# Patient Record
Sex: Female | Born: 1950 | Race: White | Hispanic: No | Marital: Married | Smoking: Never smoker
Health system: Southern US, Community
[De-identification: ages and names within clinical notes are randomized; demographics above are authoritative.]

## PROBLEM LIST (undated history)

## (undated) DIAGNOSIS — K219 Gastro-esophageal reflux disease without esophagitis: Secondary | ICD-10-CM

## (undated) DIAGNOSIS — T7840XA Allergy, unspecified, initial encounter: Secondary | ICD-10-CM

## (undated) DIAGNOSIS — I219 Acute myocardial infarction, unspecified: Secondary | ICD-10-CM

## (undated) DIAGNOSIS — E785 Hyperlipidemia, unspecified: Secondary | ICD-10-CM

## (undated) HISTORY — PX: LUMBAR DISC SURGERY: SHX700

## (undated) HISTORY — DX: Acute myocardial infarction, unspecified: I21.9

## (undated) HISTORY — DX: Hyperlipidemia, unspecified: E78.5

## (undated) HISTORY — DX: Gastro-esophageal reflux disease without esophagitis: K21.9

## (undated) HISTORY — PX: WISDOM TOOTH EXTRACTION: SHX21

## (undated) HISTORY — DX: Allergy, unspecified, initial encounter: T78.40XA

---

## 2004-09-14 ENCOUNTER — Other Ambulatory Visit: Admission: RE | Admit: 2004-09-14 | Discharge: 2004-09-14 | Payer: Self-pay | Admitting: Obstetrics and Gynecology

## 2004-09-15 ENCOUNTER — Other Ambulatory Visit: Admission: RE | Admit: 2004-09-15 | Discharge: 2004-09-15 | Payer: Self-pay | Admitting: Obstetrics and Gynecology

## 2004-11-07 ENCOUNTER — Encounter: Admission: RE | Admit: 2004-11-07 | Discharge: 2004-11-07 | Payer: Self-pay | Admitting: Internal Medicine

## 2005-09-18 ENCOUNTER — Other Ambulatory Visit: Admission: RE | Admit: 2005-09-18 | Discharge: 2005-09-18 | Payer: Self-pay | Admitting: Obstetrics and Gynecology

## 2005-11-27 ENCOUNTER — Encounter: Admission: RE | Admit: 2005-11-27 | Discharge: 2005-11-27 | Payer: Self-pay | Admitting: Internal Medicine

## 2006-09-12 ENCOUNTER — Other Ambulatory Visit: Admission: RE | Admit: 2006-09-12 | Discharge: 2006-09-12 | Payer: Self-pay | Admitting: Obstetrics and Gynecology

## 2007-01-09 ENCOUNTER — Encounter: Admission: RE | Admit: 2007-01-09 | Discharge: 2007-01-09 | Payer: Self-pay | Admitting: Obstetrics and Gynecology

## 2008-01-12 ENCOUNTER — Encounter: Admission: RE | Admit: 2008-01-12 | Discharge: 2008-01-12 | Payer: Self-pay | Admitting: Obstetrics and Gynecology

## 2008-06-15 ENCOUNTER — Other Ambulatory Visit: Admission: RE | Admit: 2008-06-15 | Discharge: 2008-06-15 | Payer: Self-pay | Admitting: Obstetrics and Gynecology

## 2009-01-17 ENCOUNTER — Encounter: Admission: RE | Admit: 2009-01-17 | Discharge: 2009-01-17 | Payer: Self-pay | Admitting: Obstetrics and Gynecology

## 2010-01-02 ENCOUNTER — Encounter: Admission: RE | Admit: 2010-01-02 | Discharge: 2010-01-02 | Payer: Self-pay | Admitting: Otolaryngology

## 2010-01-25 ENCOUNTER — Encounter: Admission: RE | Admit: 2010-01-25 | Discharge: 2010-01-25 | Payer: Self-pay | Admitting: Obstetrics and Gynecology

## 2011-01-19 ENCOUNTER — Other Ambulatory Visit: Payer: Self-pay | Admitting: Obstetrics and Gynecology

## 2011-01-19 DIAGNOSIS — Z1231 Encounter for screening mammogram for malignant neoplasm of breast: Secondary | ICD-10-CM

## 2011-02-27 ENCOUNTER — Ambulatory Visit: Payer: Self-pay

## 2011-03-08 ENCOUNTER — Ambulatory Visit
Admission: RE | Admit: 2011-03-08 | Discharge: 2011-03-08 | Disposition: A | Discharge status: Home / Self Care | Payer: 59 | Source: Ambulatory Visit | Attending: Obstetrics and Gynecology | Admitting: Obstetrics and Gynecology

## 2011-03-08 DIAGNOSIS — Z1231 Encounter for screening mammogram for malignant neoplasm of breast: Secondary | ICD-10-CM

## 2011-04-27 ENCOUNTER — Emergency Department (HOSPITAL_COMMUNITY): Payer: 59

## 2011-04-27 ENCOUNTER — Emergency Department (HOSPITAL_COMMUNITY)
Admission: EM | Admit: 2011-04-27 | Discharge: 2011-04-27 | Disposition: A | Discharge status: Home / Self Care | Payer: 59 | Attending: Emergency Medicine | Admitting: Emergency Medicine

## 2011-04-27 DIAGNOSIS — M79609 Pain in unspecified limb: Secondary | ICD-10-CM | POA: Insufficient documentation

## 2011-04-27 DIAGNOSIS — Z79899 Other long term (current) drug therapy: Secondary | ICD-10-CM | POA: Insufficient documentation

## 2011-04-27 DIAGNOSIS — M545 Low back pain, unspecified: Secondary | ICD-10-CM | POA: Insufficient documentation

## 2011-04-27 DIAGNOSIS — M5126 Other intervertebral disc displacement, lumbar region: Secondary | ICD-10-CM | POA: Insufficient documentation

## 2011-04-27 DIAGNOSIS — IMO0002 Reserved for concepts with insufficient information to code with codable children: Secondary | ICD-10-CM | POA: Insufficient documentation

## 2011-04-30 ENCOUNTER — Inpatient Hospital Stay (HOSPITAL_COMMUNITY): Payer: 59

## 2011-04-30 ENCOUNTER — Inpatient Hospital Stay (HOSPITAL_COMMUNITY)
Admission: AD | Admit: 2011-04-30 | Discharge: 2011-05-05 | DRG: 491 | Disposition: A | Discharge status: Home / Self Care | Payer: 59 | Source: Ambulatory Visit | Attending: Specialist | Admitting: Specialist

## 2011-04-30 DIAGNOSIS — M216X9 Other acquired deformities of unspecified foot: Secondary | ICD-10-CM | POA: Diagnosis present

## 2011-04-30 DIAGNOSIS — M5126 Other intervertebral disc displacement, lumbar region: Principal | ICD-10-CM | POA: Diagnosis present

## 2011-04-30 DIAGNOSIS — M48061 Spinal stenosis, lumbar region without neurogenic claudication: Secondary | ICD-10-CM | POA: Diagnosis present

## 2011-04-30 LAB — CBC
MCHC: 33.1 g/dL (ref 30.0–36.0)
MCV: 89.8 fL (ref 78.0–100.0)
Platelets: 215 10*3/uL (ref 150–400)

## 2011-04-30 LAB — URINE MICROSCOPIC-ADD ON

## 2011-04-30 LAB — BASIC METABOLIC PANEL
CO2: 25 mEq/L (ref 19–32)
Calcium: 9.7 mg/dL (ref 8.4–10.5)
Glucose, Bld: 165 mg/dL — ABNORMAL HIGH (ref 70–99)
Potassium: 3.5 mEq/L (ref 3.5–5.1)
Sodium: 135 mEq/L (ref 135–145)

## 2011-04-30 LAB — URINALYSIS, ROUTINE W REFLEX MICROSCOPIC
Bilirubin Urine: NEGATIVE
Protein, ur: NEGATIVE mg/dL
Specific Gravity, Urine: 1.011 (ref 1.005–1.030)
Urobilinogen, UA: 0.2 mg/dL (ref 0.0–1.0)

## 2011-05-02 ENCOUNTER — Inpatient Hospital Stay (HOSPITAL_COMMUNITY): Payer: 59

## 2011-05-02 ENCOUNTER — Other Ambulatory Visit: Payer: Self-pay | Admitting: Specialist

## 2011-05-03 LAB — BASIC METABOLIC PANEL
Chloride: 98 mEq/L (ref 96–112)
Creatinine, Ser: 0.5 mg/dL (ref 0.50–1.10)
GFR calc non Af Amer: >60 mL/min (ref >60)
Glucose, Bld: 114 mg/dL — ABNORMAL HIGH (ref 70–99)
Potassium: 4 mEq/L (ref 3.5–5.1)

## 2011-05-03 LAB — CBC
HCT: 38.1 % (ref 36.0–46.0)
WBC: 6.8 10*3/uL (ref 4.0–10.5)

## 2011-05-10 NOTE — Op Note (Signed)
Allison Rangel, Allison Rangel NO.:  [id number]  MEDICAL RECORD NO.:  [id number]  LOCATION:  1603                         FACILITY:  [facility]  PHYSICIAN:  Jene Every, M.D.    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  PROCEDURES PERFORMED:  Lumbar decompression L4-5 with lateral recess decompression foraminotomy of L4-5, microdiskectomy at L4-5, retrieval free fragment, application of DuraSeal to an attenuated region of thecal sac.  BRIEF HISTORY:  This is a 60 year old female who has had a severe right lower extremity radicular pain, unable to ambulate, was admitted to the hospital with a significant weakness in dorsiflexion of her right ankle, was unable to ambulate, required narcotic analgesics and lying prone on the hospital bed.  We admitted her on Monday evening, saw her yesterday, had persistent weakness and pain, and as well this morning on evaluation, she is indicated for decompression as she did not improve with rest, activity modification, had been given steroids, etc.  She was indicated for decompression of L4-5.  She had a central stenosis of L4-5 due to ligamentum flavum, hypertrophy, and stenosis, severely in the lateral recess; that combined with the HNP, was compressing the L5 root, generating the weakness into the right foot.  She had positive nerve root tension signs, EHL weakness, diminished sensation at L5 dermatome, indicated for decompression.  Risks and benefits discussed including bleeding, infection, damage to vascular structures, CSF leakage, epidural fibrosis, adjacent segment disease, need for fusion in future, DVT, PE, anesthetic complications, etc.  TECHNIQUE:  With the  patient in supine position, after induction of adequate general anesthesia and 1 g of Kefzol, she was placed prone on the [place] frame.  All bony prominences well-padded.  Lumbar region was prepped and draped in usual sterile fashion.  Two 18-gauge spinal needles were utilized to localize at L4-5 interspace, confirmed with x- ray.  Incision was made from spinous process of L4-5.  Subcutaneous tissue was dissected.  Electrocautery was utilized to achieve hemostasis. With a very small interlaminar window at L4-5 on the right, due to the degree of stenosis that she had, it was felt prudent to perform a central decompression to allow for retraction of the thecal sac and retrieval of the fragment.  I used a Leksell rongeur to remove the interspinous ligament and a spinous process, partially of L4 and L5. I used a straight curette, detached ligamentum flavum from the caudad edge of L4 and the cephalad edge of L5.  Due to severe lateral recess stenosis, entered only caudad edge of L4 centrally.  Some epidural fat was noted, we placed neural patty beneath the ligamentum flavum. Removed ligamentum flavum from the right, performed hemilaminotomies of the caudad L4 and cephalad edge of L5.  A severe lateral recess stenosis was noted.  Removed some ligamentum, also placed a hockey-stick beneath ligamentum flavum.  Clearly, there was some severe stenosis noted laterally and some adhesions.  We gently developed a plane  between the ligamentum flavum and the thecal sac.  We removed ligamentum flavum from the left centrally.  There was some attenuated area noted right on the central part of the dorsum of the thecal sac measuring 5 x 5 mm, no leakage of fluid noted.  This was covered and we continued with the procedure.  The L5 root was completely compressed due to the disk herniation, the stenosis, and the ligamentum flavum hypertrophy, and the disk herniation.  The disk herniation was identified just  posterior to the disk space and inferior to the disk space as well.  We retrieved a small fragment with a migrated caudad with a neural probe and a micropituitary.  There was still a central disk herniation into the right, I gently mobilized the L5 root medially and to the lateral aspect of it made a small incision in the annulus and then removed 3 fragments from the interlaminar space with a nerve hook and micropituitary.  This decompressed the L5 root, allowed to gently mobilized this then medially.  The disk herniation extended from lateral to medial to the midline.  We gently mobilized the thecal sac to the midline, and copious portion of disk material was removed the disk space with straight and upbiting pituitary.  She had a fairly degenerated disk with a fair amount of disk material that was removed.  We obtained a confirmatory radiograph with a Penfield 4 in the interlaminar space.  Next, we had performed foraminotomies of L5 and of L4 as well.  After full diskectomy of the herniated material, we had released 1 cm excursion of the L5 root medial to the pedicle without difficulty.  The nerve root was erythematous and edematous.  Disk space copiously irrigated with antibiotic irrigation.  No residual disk herniation beneath thecal sac, axilla of the root, the shoulder of the root.  Next, attention was turned after copious irrigation, we removed the neural patties, a small area of the attenuation of thecal sac 5 x 5 mm.  There again no CSF leakage was noted.  We felt, however, that it was appropriate to cover this with a DuraSeal.  Just prior to that and with coverage of that, we had decompressed the lateral recess on the left somewhat.  I then chambered the DuraSeal in the appropriate fashion and this was injected to right over the central laminotomy, creating a seal. Following appropriate curing, Valsalva was performed.  There was no evidence of CSF leakage.  Again, a hockey stick  probe had passed freely up foramen of L5 and L4.  Patella was well-decompressed.  Following this, we removed the [facility] retractor.  Paraspinous muscle inspected, no evidence of active bleeding.  Dorsolumbar fascia was reapproximated with #1 Vicryl and figure-of-8 suture, watertight closure, subcutaneous with 0 and 2-0 Vicryl simple sutures.  Skin was reapproximated with 4-0 subcuticular Prolene.  Wound reinforced with Steri-Strips.  Sterile dressing applied.  Placed supine on hospital bed, extubated without difficulty, and transported to recovery room in satisfactory condition.  The patient tolerated procedure well.  No complications.  ASSISTANT:  [name], PA  Minimal blood loss.     Jene Every, M.D.     Cordelia Pen  D:  05/02/2011  T:  05/03/2011  Job:  [id number]  Electronically Signed by Jene Every M.D. on 05/10/2011 07:03:30 AM

## 2011-05-15 NOTE — H&P (Signed)
NAMEALIZAE, BECHTEL NO.:  [id number]  MEDICAL RECORD NO.:  [id number]  LOCATION:                                 FACILITY:  PHYSICIAN:  Jene Every, M.D.    DATE OF BIRTH:  1951/04/28  DATE OF ADMISSION:  04/30/2011 DATE OF DISCHARGE:                             HISTORY & PHYSICAL   CHIEF COMPLAINT:  Right lower extremity pain.  HISTORY:  Allison Rangel presented to our office on the 30th with severe onset of right lower extremity pain.  She is a pleasant 60 year old female who approximately 2 months ago had some low back pain following pulling weight.  She was seen and received chiropractic therapy.  Then approximately 2 weeks ago, she noted significant increase in lower extremity pain with numbness and weakness in the lower extremity.  She was seen by primary care physician who injected cortisone, placed on steroids.  The pain continued to escalate.  She was then seen at [facility] Emergency Room where they performed MRI study, which showed disk herniation at L4-5, which had migrated caudal.  She presents to our office in severe distress with significant neurogenic signs.  So this time, she needed direct admit for pain control and definitive operative intervention.  We did discuss risks and benefits of lumbar decompression.  The patient is in agreement to proceed accordingly.  MEDICAL HISTORY:  Noncontributory.  CURRENT MEDICATIONS:  Currently on prednisone 40 mg daily, oxycodone, diazepam.  She also takes a multivitamin.  ALLERGIES:  None.  SOCIAL HISTORY:  Negative tobacco.  Negative for alcohol.  She is married, lives with her husband.  REVIEW OF SYSTEMS:  The patient denies any fever, chills, night sweats, or bleeding tendencies.  CNS:  No blurred, double vision, seizure, headache, or paralysis.  Respiratory:  No shortness of breath, productive cough, or hemoptysis.  Cardiovascular:  No shortness of breath, angina, or orthopnea.  GU:  No dysuria,  hematuria, or discharge. GI: No nausea, vomiting, diarrhea, constipation, melena, or bloody stools.  Musculoskeletal:  Per HPI.  PHYSICAL EXAMINATION:  GENERAL:  The patient is seen upright, in moderate distress.  She is most comfortable in a prone position. HEENT:  Atraumatic.  Pupils equal, round, react to light. CHEST:  Clear to auscultation bilaterally. HEART:  Regular rate and rhythm. ABDOMEN:  Soft, nontender. EXTREMITIES:  Lower extremities, she does have positive straight leg raise on the right, it produces buttock, thigh and calf pain. Significant EHL weakness on the right as well as weakness with dorsiflexion.  She has hyperreflexic patellar reflex.  Some mild dorsiflexion weakness on the left.  Decreased sensation along the L5 dermatome.  Again, MRI study was reviewed by Dr. Shelle Iron, which does show severe multifactorial stenosis L4-5 with disk herniation.  PLAN:  At this point, the patient will be direct admitted for pain control.  We will schedule for operative intervention later in the week for a lumbar decompression and microdiskectomy bilaterally.     [name], P.A.   ______________________________ Jene Every, M.D.    CS/MEDQ  D:  05/03/2011  T:  05/04/2011  Job:  [id number]  Electronically Signed by [name] P.A. on 05/14/2011 03:39:37 PM Electronically Signed  by Jene Every M.D. on 05/15/2011 01:24:24 PM

## 2011-06-01 NOTE — Discharge Summary (Signed)
  Allison Rangel, [place] NO.:  [id number]  MEDICAL RECORD NO.:  [id number]  LOCATION:  1603                         FACILITY:  [facility]  PHYSICIAN:  [name], P.A.DATE OF BIRTH:  1951-03-17  DATE OF ADMISSION:  04/30/2011 DATE OF DISCHARGE:  05/05/2011                              DISCHARGE SUMMARY   ADMISSION DIAGNOSIS:  Right lower extremity pain secondary to herniated nucleus pulposus at L4-L5.  DISCHARGE DIAGNOSIS:  Right lower extremity pain secondary to herniated nucleus pulposus at L4-L5, status post lumbar decompression microdiskectomy at L4-L5.  HISTORY:  [name] is a pleasant female.  She presented to our office with severe onset of right lower extremity pain, ongoing for quite some time, it worsened over the past several weeks.  MR study at [facility] Emergency Room showed disk herniation at L4-L5 with caudal migration. The patient in our office does have positive neurogenic signs ofsignificant footdrop, uncontrolled pain.  At this point, it was felt admission for pain control followed by definitive operative intervention was necessary.  PROCEDURE:  The patient was taken to the OR on May 02, 2011, underwent lumbar decompression, microdiskectomy at L4-L5 with application of Durafill secondary to attenuated region of thecal sac.  SURGEON:  Jene Every, M.D.  ASSISTANT:  [name], P.A.  ANESTHESIA:  General.  COMPLICATIONS:  None.  CONSULTS:  PT/OT Care Management.  HOSPITAL COURSE:  The patient was initially admitted for pain control. Then operative intervention was performed as per the above procedure note.  Postop day #1, the patient was doing significantly better.  She denied any headache or shortness of breath.  She noted improvement in lower extremity symptoms, in foot strength.  Over the course of the next several days, PT/OT was consulted.  Activity was increased.  Vital signs remained stable.  She was afebrile.   She could note improvement in lower extremity symptoms, with sensation and weakness.  Postop day #3, it was felt the patient was stable to be discharged home with any home health needs met.  She will follow with Dr. Shelle Iron from 10 to 14 days postoperatively.  ACTIVITY:  She is to walk as tolerated utilizing proper back precautions.  MEDICATIONS:  As per med rec sheet.  DIET:  As tolerated.  CONDITION ON DISCHARGE:  Stable.  FINAL DIAGNOSIS:  Status post lumbar decompression, microdiskectomy at L4-L5.     [name], P.A.     CS/MEDQ  D:  05/28/2011  T:  05/28/2011  Job:  [id number]  Electronically Signed by [name] P.A. on 05/30/2011 04:56:32 PM Electronically Signed by Jene Every M.D. on 06/01/2011 02:41:40 PM

## 2012-01-08 ENCOUNTER — Other Ambulatory Visit (HOSPITAL_COMMUNITY)
Admission: RE | Admit: 2012-01-08 | Discharge: 2012-01-08 | Disposition: A | Discharge status: Home / Self Care | Payer: 59 | Source: Ambulatory Visit | Attending: Obstetrics and Gynecology | Admitting: Obstetrics and Gynecology

## 2012-01-08 ENCOUNTER — Other Ambulatory Visit: Payer: Self-pay | Admitting: Nurse Practitioner

## 2012-01-08 DIAGNOSIS — Z1159 Encounter for screening for other viral diseases: Secondary | ICD-10-CM | POA: Insufficient documentation

## 2012-01-08 DIAGNOSIS — Z01419 Encounter for gynecological examination (general) (routine) without abnormal findings: Secondary | ICD-10-CM | POA: Insufficient documentation

## 2012-05-13 ENCOUNTER — Other Ambulatory Visit: Payer: Self-pay | Admitting: Internal Medicine

## 2012-05-13 DIAGNOSIS — Z1231 Encounter for screening mammogram for malignant neoplasm of breast: Secondary | ICD-10-CM

## 2012-05-29 ENCOUNTER — Ambulatory Visit
Admission: RE | Admit: 2012-05-29 | Discharge: 2012-05-29 | Disposition: A | Discharge status: Home / Self Care | Payer: 59 | Source: Ambulatory Visit | Attending: Internal Medicine | Admitting: Internal Medicine

## 2012-05-29 DIAGNOSIS — Z1231 Encounter for screening mammogram for malignant neoplasm of breast: Secondary | ICD-10-CM

## 2013-06-12 ENCOUNTER — Other Ambulatory Visit: Payer: Self-pay

## 2013-06-12 DIAGNOSIS — Z1231 Encounter for screening mammogram for malignant neoplasm of breast: Secondary | ICD-10-CM

## 2013-07-02 ENCOUNTER — Ambulatory Visit
Admission: RE | Admit: 2013-07-02 | Discharge: 2013-07-02 | Disposition: A | Discharge status: Home / Self Care | Payer: BC Managed Care – PPO | Source: Ambulatory Visit

## 2013-07-02 DIAGNOSIS — Z1231 Encounter for screening mammogram for malignant neoplasm of breast: Secondary | ICD-10-CM

## 2014-08-20 ENCOUNTER — Other Ambulatory Visit: Payer: Self-pay

## 2014-08-20 DIAGNOSIS — Z1231 Encounter for screening mammogram for malignant neoplasm of breast: Secondary | ICD-10-CM

## 2014-09-03 ENCOUNTER — Ambulatory Visit: Payer: BC Managed Care – PPO

## 2014-09-13 ENCOUNTER — Ambulatory Visit: Payer: BC Managed Care – PPO

## 2014-09-20 ENCOUNTER — Ambulatory Visit
Admission: RE | Admit: 2014-09-20 | Discharge: 2014-09-20 | Disposition: A | Discharge status: Home / Self Care | Payer: BC Managed Care – PPO | Source: Ambulatory Visit

## 2014-09-20 DIAGNOSIS — Z1231 Encounter for screening mammogram for malignant neoplasm of breast: Secondary | ICD-10-CM

## 2015-01-18 ENCOUNTER — Other Ambulatory Visit: Payer: Self-pay | Admitting: Nurse Practitioner

## 2015-01-18 ENCOUNTER — Other Ambulatory Visit (HOSPITAL_COMMUNITY)
Admission: RE | Admit: 2015-01-18 | Discharge: 2015-01-18 | Disposition: A | Discharge status: Home / Self Care | Payer: Self-pay | Source: Ambulatory Visit | Attending: Nurse Practitioner | Admitting: Nurse Practitioner

## 2015-01-18 DIAGNOSIS — Z1151 Encounter for screening for human papillomavirus (HPV): Secondary | ICD-10-CM | POA: Insufficient documentation

## 2015-01-18 DIAGNOSIS — Z01419 Encounter for gynecological examination (general) (routine) without abnormal findings: Secondary | ICD-10-CM | POA: Insufficient documentation

## 2015-01-19 ENCOUNTER — Other Ambulatory Visit: Payer: Self-pay | Admitting: Internal Medicine

## 2015-01-19 DIAGNOSIS — K222 Esophageal obstruction: Secondary | ICD-10-CM

## 2015-01-19 LAB — CYTOLOGY - PAP

## 2015-01-24 ENCOUNTER — Ambulatory Visit
Admission: RE | Admit: 2015-01-24 | Discharge: 2015-01-24 | Disposition: A | Discharge status: Home / Self Care | Payer: No Typology Code available for payment source | Source: Ambulatory Visit | Attending: Internal Medicine | Admitting: Internal Medicine

## 2015-01-24 DIAGNOSIS — K222 Esophageal obstruction: Secondary | ICD-10-CM

## 2016-05-18 ENCOUNTER — Other Ambulatory Visit: Payer: Self-pay | Admitting: Nurse Practitioner

## 2016-05-18 DIAGNOSIS — Z1231 Encounter for screening mammogram for malignant neoplasm of breast: Secondary | ICD-10-CM

## 2016-05-28 ENCOUNTER — Ambulatory Visit
Admission: RE | Admit: 2016-05-28 | Discharge: 2016-05-28 | Disposition: A | Discharge status: Home / Self Care | Payer: No Typology Code available for payment source | Source: Ambulatory Visit | Attending: Nurse Practitioner | Admitting: Nurse Practitioner

## 2016-05-28 DIAGNOSIS — Z1231 Encounter for screening mammogram for malignant neoplasm of breast: Secondary | ICD-10-CM

## 2016-10-21 IMAGING — RF DG ESOPHAGUS
12 series · 12 of 12 positions shown · non-contrast
Comparison: Esophagram January 02, 2010

CLINICAL DATA: Evaluate esophageal stricture

EXAM:
ESOPHOGRAM / BARIUM SWALLOW / BARIUM TABLET STUDY
TECHNIQUE: Combined double contrast and single contrast examination performed
using effervescent crystals, thick barium liquid, and thin barium
liquid. The patient was observed with fluoroscopy swallowing a 13 mm
barium sulphate tablet.
FLUOROSCOPY TIME:  Radiation Exposure Index (as provided by the
fluoroscopic device): 20 dOycm3
If the device does not provide the exposure index:
Fluoroscopy Time:  1 minutes, 0 seconds
Number of Acquired Images:  12

[Series 1: run · 1 of 1 slices shown (1 of 12)]
[im 1/1]
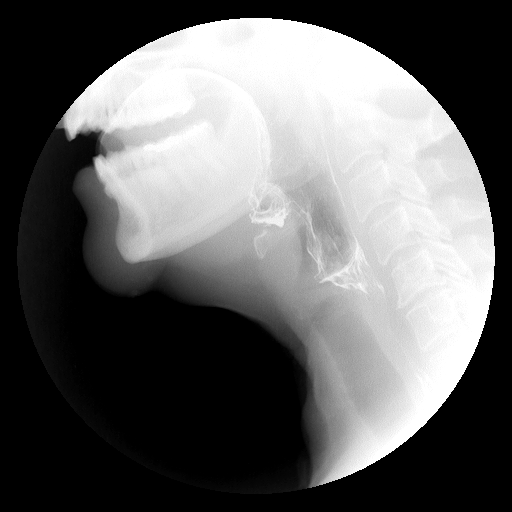

[Series 2: run · 1 of 1 slices shown (2 of 12)]
[im 1/1]
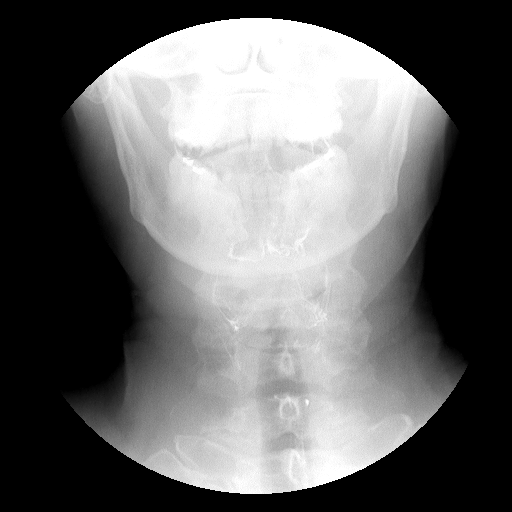

[Series 3: run · 1 of 1 slices shown (3 of 12)]
[im 1/1]
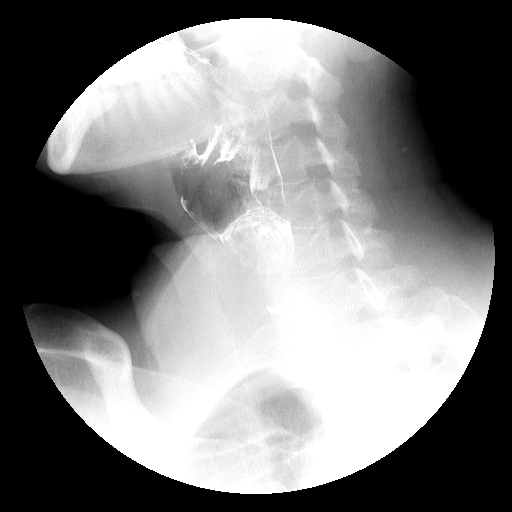

[Series 4: run · 1 of 1 slices shown (4 of 12)]
[im 1/1]
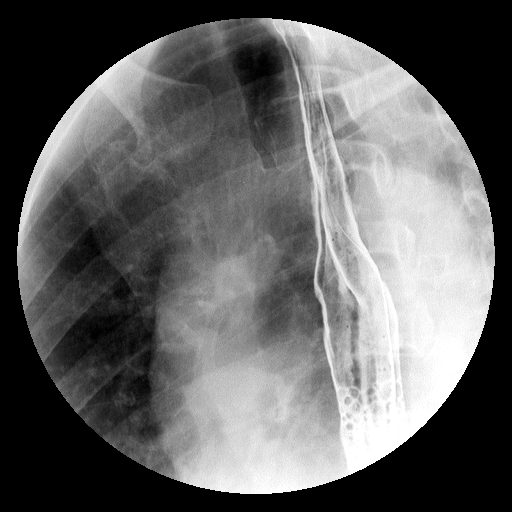

[Series 5: run · 1 of 1 slices shown (5 of 12)]
[im 1/1]
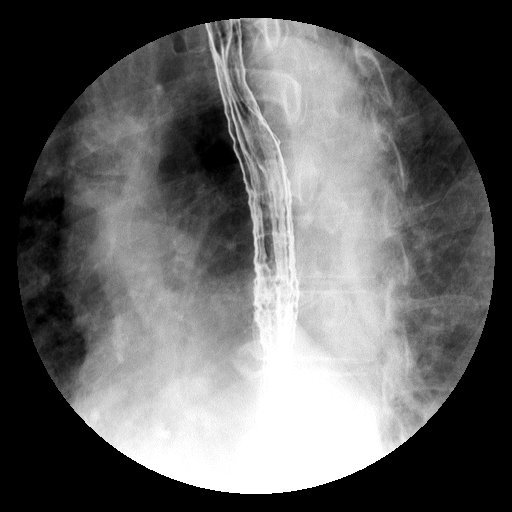

[Series 6: run · 1 of 1 slices shown (6 of 12)]
[im 1/1]
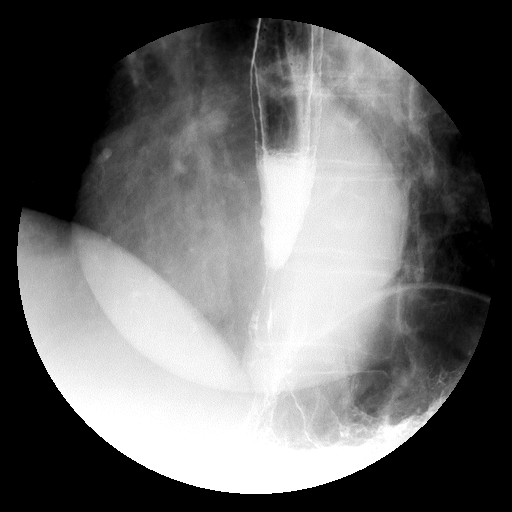

[Series 7: run · 1 of 1 slices shown (7 of 12)]
[im 1/1]
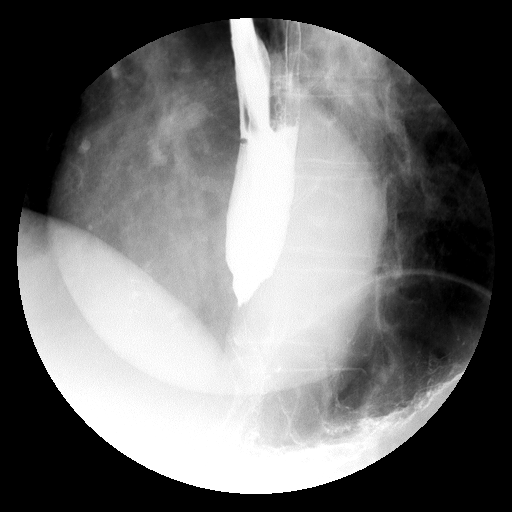

[Series 8: run · 1 of 1 slices shown (8 of 12)]
[im 1/1]
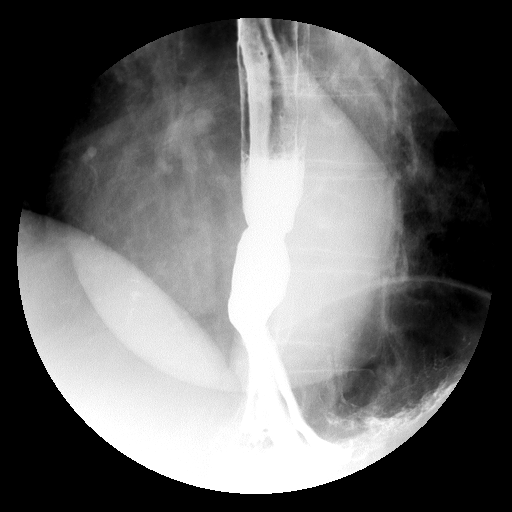

[Series 9: run · 1 of 1 slices shown (9 of 12)]
[im 1/1]
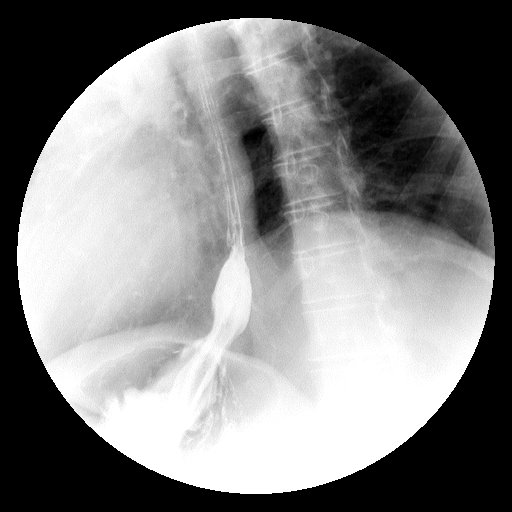

[Series 10: run · 1 of 1 slices shown (10 of 12)]
[im 1/1]
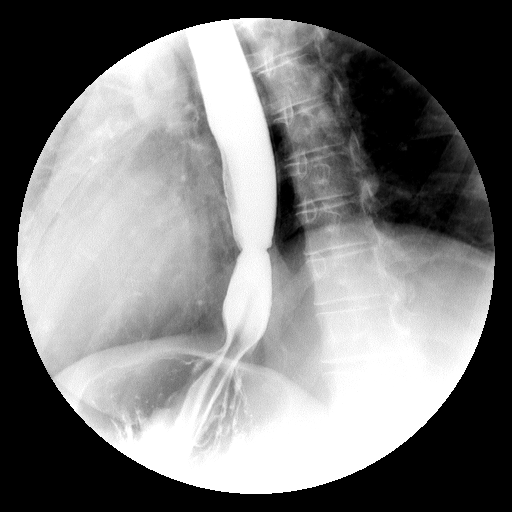

[Series 11: run · 1 of 1 slices shown (11 of 12)]
[im 1/1]
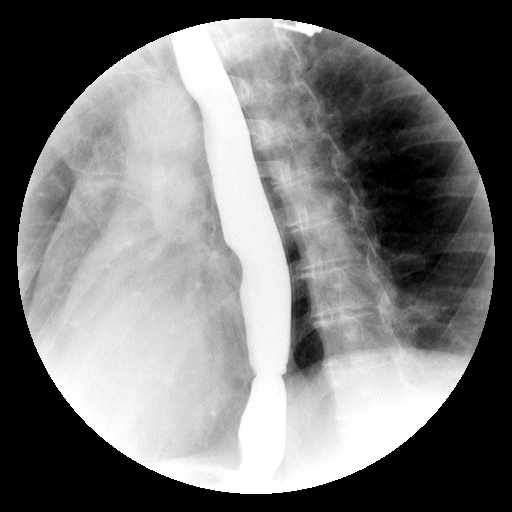

[Series 12: run · 1 of 1 slices shown (12 of 12)]
[im 1/1]
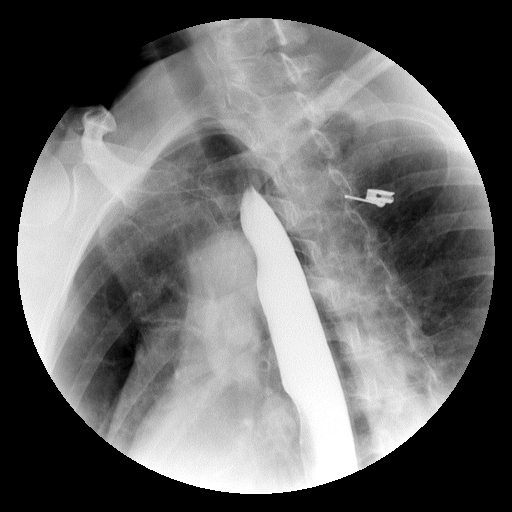

[12 of 12 positions shown; findings below may reference images not displayed]

FINDINGS: The cervical esophagus distended well. There was no laryngeal
penetration of the barium. The thoracic esophagus also distended
well. A few tertiary contractions were demonstrated. There was a
small incompletely reducible hiatal hernia and a distal esophageal
ring. This ring however did allow passage of the barium tablet
without difficulty. There was no evidence of ulceration. No reflux
was observed.
IMPRESSION: Small non reducible hiatal hernia with a distal esophageal ring.
However, the 13 mm barium tablet passed through this ring without
difficulty. There was no evidence of reflux or esophagitis.

## 2017-12-11 DIAGNOSIS — H401131 Primary open-angle glaucoma, bilateral, mild stage: Secondary | ICD-10-CM | POA: Diagnosis not present

## 2018-01-02 DIAGNOSIS — R69 Illness, unspecified: Secondary | ICD-10-CM | POA: Diagnosis not present

## 2018-03-10 DIAGNOSIS — H5213 Myopia, bilateral: Secondary | ICD-10-CM | POA: Diagnosis not present

## 2018-03-19 DIAGNOSIS — Z833 Family history of diabetes mellitus: Secondary | ICD-10-CM | POA: Diagnosis not present

## 2018-03-19 DIAGNOSIS — Z8249 Family history of ischemic heart disease and other diseases of the circulatory system: Secondary | ICD-10-CM | POA: Diagnosis not present

## 2018-03-19 DIAGNOSIS — Z809 Family history of malignant neoplasm, unspecified: Secondary | ICD-10-CM | POA: Diagnosis not present

## 2018-03-19 DIAGNOSIS — K30 Functional dyspepsia: Secondary | ICD-10-CM | POA: Diagnosis not present

## 2018-03-19 DIAGNOSIS — Z803 Family history of malignant neoplasm of breast: Secondary | ICD-10-CM | POA: Diagnosis not present

## 2018-03-19 DIAGNOSIS — Z823 Family history of stroke: Secondary | ICD-10-CM | POA: Diagnosis not present

## 2018-03-19 DIAGNOSIS — Z91038 Other insect allergy status: Secondary | ICD-10-CM | POA: Diagnosis not present

## 2018-03-19 DIAGNOSIS — H409 Unspecified glaucoma: Secondary | ICD-10-CM | POA: Diagnosis not present

## 2018-03-19 DIAGNOSIS — Z91041 Radiographic dye allergy status: Secondary | ICD-10-CM | POA: Diagnosis not present

## 2018-04-21 DIAGNOSIS — R69 Illness, unspecified: Secondary | ICD-10-CM | POA: Diagnosis not present

## 2018-04-28 DIAGNOSIS — H401131 Primary open-angle glaucoma, bilateral, mild stage: Secondary | ICD-10-CM | POA: Diagnosis not present

## 2018-06-10 DIAGNOSIS — Z23 Encounter for immunization: Secondary | ICD-10-CM | POA: Diagnosis not present

## 2018-06-10 DIAGNOSIS — K219 Gastro-esophageal reflux disease without esophagitis: Secondary | ICD-10-CM | POA: Diagnosis not present

## 2018-06-10 DIAGNOSIS — Z0001 Encounter for general adult medical examination with abnormal findings: Secondary | ICD-10-CM | POA: Diagnosis not present

## 2018-06-10 DIAGNOSIS — Z136 Encounter for screening for cardiovascular disorders: Secondary | ICD-10-CM | POA: Diagnosis not present

## 2018-06-10 DIAGNOSIS — Z13220 Encounter for screening for lipoid disorders: Secondary | ICD-10-CM | POA: Diagnosis not present

## 2018-06-10 DIAGNOSIS — Z131 Encounter for screening for diabetes mellitus: Secondary | ICD-10-CM | POA: Diagnosis not present

## 2018-06-10 DIAGNOSIS — M8580 Other specified disorders of bone density and structure, unspecified site: Secondary | ICD-10-CM | POA: Diagnosis not present

## 2018-07-01 ENCOUNTER — Encounter: Payer: Medicare HMO | Attending: Internal Medicine | Admitting: Dietician

## 2018-07-01 DIAGNOSIS — Z713 Dietary counseling and surveillance: Secondary | ICD-10-CM | POA: Insufficient documentation

## 2018-07-01 DIAGNOSIS — K219 Gastro-esophageal reflux disease without esophagitis: Secondary | ICD-10-CM | POA: Insufficient documentation

## 2018-07-01 NOTE — Progress Notes (Signed)
  Medical Nutrition Therapy:  Appt start time: 3:25pm end time: 4:25   Assessment:  Primary concerns today: questions about supplements and acid refux.   MEDICATIONS: eye drops   Supplements: Shaklee +Bone Chewable (Cal Mag Plus), Shaklee Multi+ Vita-Lea Women, Shaklee +Omega OmegaGuard, DigestZen PB Assist  Allergies: bactrim, sulfa drug    DIETARY INTAKE:    Usual eating pattern includes 3 meals and 1 snack per day.   Everyday foods include eggs, bread, fruit, vegetables, meat.  Avoided foods include    24-hr recall:  B ( AM): eggs, toast, fruit, juice, hot tea Snk ( AM):   L ( PM): sandwich, fruit or leftover Snk ( PM): dessert (cake or banana bread) D ( PM): meat, vegetables, fruit  Snk ( PM): may have cereal in the middle of the night Beverages: juice, hot tea, water, decaf tea/coffee, milk  Usual physical activity: yoga/pilates 2x/week, swimming, walk 1x/week, gardening     Nutritional Diagnosis:  NB-1.1 Food and nutrition-related knowledge deficit As related to newly diagnosed with GERD.  As evidenced by referral for MNT.    Intervention: .  Handouts given during visit include:  Gastroesophageal Reflux Disease Nutrition Therapy (NCM)  Barriers to learning/adherence to lifestyle change: none identified  Demonstrated degree of understanding via:  Teach Back   Monitoring/Evaluation:  Dietary intake, exercise, and body weight prn.

## 2018-07-07 DIAGNOSIS — H401131 Primary open-angle glaucoma, bilateral, mild stage: Secondary | ICD-10-CM | POA: Diagnosis not present

## 2018-09-12 DIAGNOSIS — Z803 Family history of malignant neoplasm of breast: Secondary | ICD-10-CM | POA: Diagnosis not present

## 2018-09-12 DIAGNOSIS — Z1231 Encounter for screening mammogram for malignant neoplasm of breast: Secondary | ICD-10-CM | POA: Diagnosis not present

## 2018-11-04 DIAGNOSIS — R69 Illness, unspecified: Secondary | ICD-10-CM | POA: Diagnosis not present

## 2018-11-14 DIAGNOSIS — H401131 Primary open-angle glaucoma, bilateral, mild stage: Secondary | ICD-10-CM | POA: Diagnosis not present

## 2019-03-06 DIAGNOSIS — H401131 Primary open-angle glaucoma, bilateral, mild stage: Secondary | ICD-10-CM | POA: Diagnosis not present

## 2019-03-30 DIAGNOSIS — Z1211 Encounter for screening for malignant neoplasm of colon: Secondary | ICD-10-CM | POA: Diagnosis not present

## 2019-03-30 DIAGNOSIS — Z1212 Encounter for screening for malignant neoplasm of rectum: Secondary | ICD-10-CM | POA: Diagnosis not present

## 2019-05-06 DIAGNOSIS — R21 Rash and other nonspecific skin eruption: Secondary | ICD-10-CM | POA: Diagnosis not present

## 2019-05-07 DIAGNOSIS — R69 Illness, unspecified: Secondary | ICD-10-CM | POA: Diagnosis not present

## 2019-07-02 DIAGNOSIS — H5213 Myopia, bilateral: Secondary | ICD-10-CM | POA: Diagnosis not present

## 2019-07-03 DIAGNOSIS — M8580 Other specified disorders of bone density and structure, unspecified site: Secondary | ICD-10-CM | POA: Diagnosis not present

## 2019-07-03 DIAGNOSIS — Z0000 Encounter for general adult medical examination without abnormal findings: Secondary | ICD-10-CM | POA: Diagnosis not present

## 2019-07-03 DIAGNOSIS — E7800 Pure hypercholesterolemia, unspecified: Secondary | ICD-10-CM | POA: Diagnosis not present

## 2019-07-03 DIAGNOSIS — Z1389 Encounter for screening for other disorder: Secondary | ICD-10-CM | POA: Diagnosis not present

## 2019-08-17 DIAGNOSIS — R69 Illness, unspecified: Secondary | ICD-10-CM | POA: Diagnosis not present

## 2019-09-14 DIAGNOSIS — Z780 Asymptomatic menopausal state: Secondary | ICD-10-CM | POA: Diagnosis not present

## 2019-09-14 DIAGNOSIS — M8589 Other specified disorders of bone density and structure, multiple sites: Secondary | ICD-10-CM | POA: Diagnosis not present

## 2019-09-14 DIAGNOSIS — Z803 Family history of malignant neoplasm of breast: Secondary | ICD-10-CM | POA: Diagnosis not present

## 2019-09-14 DIAGNOSIS — Z1231 Encounter for screening mammogram for malignant neoplasm of breast: Secondary | ICD-10-CM | POA: Diagnosis not present

## 2019-09-14 DIAGNOSIS — Z8262 Family history of osteoporosis: Secondary | ICD-10-CM | POA: Diagnosis not present

## 2019-11-06 DIAGNOSIS — H401131 Primary open-angle glaucoma, bilateral, mild stage: Secondary | ICD-10-CM | POA: Diagnosis not present

## 2019-11-12 DIAGNOSIS — R69 Illness, unspecified: Secondary | ICD-10-CM | POA: Diagnosis not present

## 2019-12-31 DIAGNOSIS — M25551 Pain in right hip: Secondary | ICD-10-CM | POA: Diagnosis not present

## 2019-12-31 DIAGNOSIS — M9905 Segmental and somatic dysfunction of pelvic region: Secondary | ICD-10-CM | POA: Diagnosis not present

## 2019-12-31 DIAGNOSIS — M9903 Segmental and somatic dysfunction of lumbar region: Secondary | ICD-10-CM | POA: Diagnosis not present

## 2019-12-31 DIAGNOSIS — M9901 Segmental and somatic dysfunction of cervical region: Secondary | ICD-10-CM | POA: Diagnosis not present

## 2020-01-04 DIAGNOSIS — M25551 Pain in right hip: Secondary | ICD-10-CM | POA: Diagnosis not present

## 2020-01-04 DIAGNOSIS — M9901 Segmental and somatic dysfunction of cervical region: Secondary | ICD-10-CM | POA: Diagnosis not present

## 2020-01-04 DIAGNOSIS — M9905 Segmental and somatic dysfunction of pelvic region: Secondary | ICD-10-CM | POA: Diagnosis not present

## 2020-01-04 DIAGNOSIS — M9903 Segmental and somatic dysfunction of lumbar region: Secondary | ICD-10-CM | POA: Diagnosis not present

## 2020-01-05 DIAGNOSIS — M25551 Pain in right hip: Secondary | ICD-10-CM | POA: Diagnosis not present

## 2020-01-05 DIAGNOSIS — M9905 Segmental and somatic dysfunction of pelvic region: Secondary | ICD-10-CM | POA: Diagnosis not present

## 2020-01-05 DIAGNOSIS — M9903 Segmental and somatic dysfunction of lumbar region: Secondary | ICD-10-CM | POA: Diagnosis not present

## 2020-01-05 DIAGNOSIS — M9901 Segmental and somatic dysfunction of cervical region: Secondary | ICD-10-CM | POA: Diagnosis not present

## 2020-01-07 DIAGNOSIS — M9903 Segmental and somatic dysfunction of lumbar region: Secondary | ICD-10-CM | POA: Diagnosis not present

## 2020-01-07 DIAGNOSIS — M25551 Pain in right hip: Secondary | ICD-10-CM | POA: Diagnosis not present

## 2020-01-07 DIAGNOSIS — M9901 Segmental and somatic dysfunction of cervical region: Secondary | ICD-10-CM | POA: Diagnosis not present

## 2020-01-07 DIAGNOSIS — M9905 Segmental and somatic dysfunction of pelvic region: Secondary | ICD-10-CM | POA: Diagnosis not present

## 2020-01-11 DIAGNOSIS — M9901 Segmental and somatic dysfunction of cervical region: Secondary | ICD-10-CM | POA: Diagnosis not present

## 2020-01-11 DIAGNOSIS — M25551 Pain in right hip: Secondary | ICD-10-CM | POA: Diagnosis not present

## 2020-01-11 DIAGNOSIS — M9903 Segmental and somatic dysfunction of lumbar region: Secondary | ICD-10-CM | POA: Diagnosis not present

## 2020-01-11 DIAGNOSIS — M9905 Segmental and somatic dysfunction of pelvic region: Secondary | ICD-10-CM | POA: Diagnosis not present

## 2020-01-12 DIAGNOSIS — M25551 Pain in right hip: Secondary | ICD-10-CM | POA: Diagnosis not present

## 2020-01-12 DIAGNOSIS — M9901 Segmental and somatic dysfunction of cervical region: Secondary | ICD-10-CM | POA: Diagnosis not present

## 2020-01-12 DIAGNOSIS — M9905 Segmental and somatic dysfunction of pelvic region: Secondary | ICD-10-CM | POA: Diagnosis not present

## 2020-01-12 DIAGNOSIS — M9903 Segmental and somatic dysfunction of lumbar region: Secondary | ICD-10-CM | POA: Diagnosis not present

## 2020-01-14 DIAGNOSIS — M9903 Segmental and somatic dysfunction of lumbar region: Secondary | ICD-10-CM | POA: Diagnosis not present

## 2020-01-14 DIAGNOSIS — M9905 Segmental and somatic dysfunction of pelvic region: Secondary | ICD-10-CM | POA: Diagnosis not present

## 2020-01-14 DIAGNOSIS — M25551 Pain in right hip: Secondary | ICD-10-CM | POA: Diagnosis not present

## 2020-01-14 DIAGNOSIS — M9901 Segmental and somatic dysfunction of cervical region: Secondary | ICD-10-CM | POA: Diagnosis not present

## 2020-01-15 DIAGNOSIS — H269 Unspecified cataract: Secondary | ICD-10-CM | POA: Diagnosis not present

## 2020-01-15 DIAGNOSIS — K219 Gastro-esophageal reflux disease without esophagitis: Secondary | ICD-10-CM | POA: Diagnosis not present

## 2020-01-15 DIAGNOSIS — H409 Unspecified glaucoma: Secondary | ICD-10-CM | POA: Diagnosis not present

## 2020-01-15 DIAGNOSIS — R030 Elevated blood-pressure reading, without diagnosis of hypertension: Secondary | ICD-10-CM | POA: Diagnosis not present

## 2020-01-15 DIAGNOSIS — Z823 Family history of stroke: Secondary | ICD-10-CM | POA: Diagnosis not present

## 2020-01-15 DIAGNOSIS — Z809 Family history of malignant neoplasm, unspecified: Secondary | ICD-10-CM | POA: Diagnosis not present

## 2020-01-19 DIAGNOSIS — M9901 Segmental and somatic dysfunction of cervical region: Secondary | ICD-10-CM | POA: Diagnosis not present

## 2020-01-19 DIAGNOSIS — M9905 Segmental and somatic dysfunction of pelvic region: Secondary | ICD-10-CM | POA: Diagnosis not present

## 2020-01-19 DIAGNOSIS — M25551 Pain in right hip: Secondary | ICD-10-CM | POA: Diagnosis not present

## 2020-01-19 DIAGNOSIS — M9903 Segmental and somatic dysfunction of lumbar region: Secondary | ICD-10-CM | POA: Diagnosis not present

## 2020-01-21 DIAGNOSIS — M9903 Segmental and somatic dysfunction of lumbar region: Secondary | ICD-10-CM | POA: Diagnosis not present

## 2020-01-21 DIAGNOSIS — M25551 Pain in right hip: Secondary | ICD-10-CM | POA: Diagnosis not present

## 2020-01-21 DIAGNOSIS — M9905 Segmental and somatic dysfunction of pelvic region: Secondary | ICD-10-CM | POA: Diagnosis not present

## 2020-01-21 DIAGNOSIS — M9901 Segmental and somatic dysfunction of cervical region: Secondary | ICD-10-CM | POA: Diagnosis not present

## 2020-01-25 DIAGNOSIS — M25551 Pain in right hip: Secondary | ICD-10-CM | POA: Diagnosis not present

## 2020-01-25 DIAGNOSIS — M9901 Segmental and somatic dysfunction of cervical region: Secondary | ICD-10-CM | POA: Diagnosis not present

## 2020-01-25 DIAGNOSIS — M9905 Segmental and somatic dysfunction of pelvic region: Secondary | ICD-10-CM | POA: Diagnosis not present

## 2020-01-25 DIAGNOSIS — M9903 Segmental and somatic dysfunction of lumbar region: Secondary | ICD-10-CM | POA: Diagnosis not present

## 2020-01-26 DIAGNOSIS — M9905 Segmental and somatic dysfunction of pelvic region: Secondary | ICD-10-CM | POA: Diagnosis not present

## 2020-01-26 DIAGNOSIS — M25551 Pain in right hip: Secondary | ICD-10-CM | POA: Diagnosis not present

## 2020-01-26 DIAGNOSIS — M9901 Segmental and somatic dysfunction of cervical region: Secondary | ICD-10-CM | POA: Diagnosis not present

## 2020-01-26 DIAGNOSIS — M9903 Segmental and somatic dysfunction of lumbar region: Secondary | ICD-10-CM | POA: Diagnosis not present

## 2020-01-28 DIAGNOSIS — M25551 Pain in right hip: Secondary | ICD-10-CM | POA: Diagnosis not present

## 2020-01-28 DIAGNOSIS — M9905 Segmental and somatic dysfunction of pelvic region: Secondary | ICD-10-CM | POA: Diagnosis not present

## 2020-01-28 DIAGNOSIS — M9901 Segmental and somatic dysfunction of cervical region: Secondary | ICD-10-CM | POA: Diagnosis not present

## 2020-01-28 DIAGNOSIS — M9903 Segmental and somatic dysfunction of lumbar region: Secondary | ICD-10-CM | POA: Diagnosis not present

## 2020-02-02 DIAGNOSIS — M9905 Segmental and somatic dysfunction of pelvic region: Secondary | ICD-10-CM | POA: Diagnosis not present

## 2020-02-02 DIAGNOSIS — M9903 Segmental and somatic dysfunction of lumbar region: Secondary | ICD-10-CM | POA: Diagnosis not present

## 2020-02-02 DIAGNOSIS — M25551 Pain in right hip: Secondary | ICD-10-CM | POA: Diagnosis not present

## 2020-02-02 DIAGNOSIS — M9901 Segmental and somatic dysfunction of cervical region: Secondary | ICD-10-CM | POA: Diagnosis not present

## 2020-02-05 DIAGNOSIS — M9901 Segmental and somatic dysfunction of cervical region: Secondary | ICD-10-CM | POA: Diagnosis not present

## 2020-02-05 DIAGNOSIS — M25551 Pain in right hip: Secondary | ICD-10-CM | POA: Diagnosis not present

## 2020-02-05 DIAGNOSIS — M9905 Segmental and somatic dysfunction of pelvic region: Secondary | ICD-10-CM | POA: Diagnosis not present

## 2020-02-05 DIAGNOSIS — M9903 Segmental and somatic dysfunction of lumbar region: Secondary | ICD-10-CM | POA: Diagnosis not present

## 2020-02-09 DIAGNOSIS — M9901 Segmental and somatic dysfunction of cervical region: Secondary | ICD-10-CM | POA: Diagnosis not present

## 2020-02-09 DIAGNOSIS — M25551 Pain in right hip: Secondary | ICD-10-CM | POA: Diagnosis not present

## 2020-02-09 DIAGNOSIS — M9905 Segmental and somatic dysfunction of pelvic region: Secondary | ICD-10-CM | POA: Diagnosis not present

## 2020-02-09 DIAGNOSIS — M9903 Segmental and somatic dysfunction of lumbar region: Secondary | ICD-10-CM | POA: Diagnosis not present

## 2020-02-11 DIAGNOSIS — M9905 Segmental and somatic dysfunction of pelvic region: Secondary | ICD-10-CM | POA: Diagnosis not present

## 2020-02-11 DIAGNOSIS — M9901 Segmental and somatic dysfunction of cervical region: Secondary | ICD-10-CM | POA: Diagnosis not present

## 2020-02-11 DIAGNOSIS — M9903 Segmental and somatic dysfunction of lumbar region: Secondary | ICD-10-CM | POA: Diagnosis not present

## 2020-02-11 DIAGNOSIS — M25551 Pain in right hip: Secondary | ICD-10-CM | POA: Diagnosis not present

## 2020-02-16 DIAGNOSIS — M9903 Segmental and somatic dysfunction of lumbar region: Secondary | ICD-10-CM | POA: Diagnosis not present

## 2020-02-16 DIAGNOSIS — M9905 Segmental and somatic dysfunction of pelvic region: Secondary | ICD-10-CM | POA: Diagnosis not present

## 2020-02-16 DIAGNOSIS — M9901 Segmental and somatic dysfunction of cervical region: Secondary | ICD-10-CM | POA: Diagnosis not present

## 2020-02-16 DIAGNOSIS — M25551 Pain in right hip: Secondary | ICD-10-CM | POA: Diagnosis not present

## 2020-02-18 DIAGNOSIS — M9903 Segmental and somatic dysfunction of lumbar region: Secondary | ICD-10-CM | POA: Diagnosis not present

## 2020-02-18 DIAGNOSIS — M9905 Segmental and somatic dysfunction of pelvic region: Secondary | ICD-10-CM | POA: Diagnosis not present

## 2020-02-18 DIAGNOSIS — M9901 Segmental and somatic dysfunction of cervical region: Secondary | ICD-10-CM | POA: Diagnosis not present

## 2020-02-18 DIAGNOSIS — M25551 Pain in right hip: Secondary | ICD-10-CM | POA: Diagnosis not present

## 2020-02-23 DIAGNOSIS — M9901 Segmental and somatic dysfunction of cervical region: Secondary | ICD-10-CM | POA: Diagnosis not present

## 2020-02-23 DIAGNOSIS — M9903 Segmental and somatic dysfunction of lumbar region: Secondary | ICD-10-CM | POA: Diagnosis not present

## 2020-02-23 DIAGNOSIS — M9905 Segmental and somatic dysfunction of pelvic region: Secondary | ICD-10-CM | POA: Diagnosis not present

## 2020-02-23 DIAGNOSIS — M25551 Pain in right hip: Secondary | ICD-10-CM | POA: Diagnosis not present

## 2020-03-08 DIAGNOSIS — H401131 Primary open-angle glaucoma, bilateral, mild stage: Secondary | ICD-10-CM | POA: Diagnosis not present

## 2020-05-26 DIAGNOSIS — R69 Illness, unspecified: Secondary | ICD-10-CM | POA: Diagnosis not present

## 2020-08-29 DIAGNOSIS — H5213 Myopia, bilateral: Secondary | ICD-10-CM | POA: Diagnosis not present

## 2020-09-14 DIAGNOSIS — Z1231 Encounter for screening mammogram for malignant neoplasm of breast: Secondary | ICD-10-CM | POA: Diagnosis not present

## 2020-09-14 DIAGNOSIS — Z803 Family history of malignant neoplasm of breast: Secondary | ICD-10-CM | POA: Diagnosis not present

## 2020-09-15 DIAGNOSIS — E7800 Pure hypercholesterolemia, unspecified: Secondary | ICD-10-CM | POA: Diagnosis not present

## 2020-09-15 DIAGNOSIS — M859 Disorder of bone density and structure, unspecified: Secondary | ICD-10-CM | POA: Diagnosis not present

## 2020-09-15 DIAGNOSIS — Z1389 Encounter for screening for other disorder: Secondary | ICD-10-CM | POA: Diagnosis not present

## 2020-09-15 DIAGNOSIS — Z0000 Encounter for general adult medical examination without abnormal findings: Secondary | ICD-10-CM | POA: Diagnosis not present

## 2020-12-27 DIAGNOSIS — Z0100 Encounter for examination of eyes and vision without abnormal findings: Secondary | ICD-10-CM | POA: Diagnosis not present

## 2020-12-27 DIAGNOSIS — H401131 Primary open-angle glaucoma, bilateral, mild stage: Secondary | ICD-10-CM | POA: Diagnosis not present

## 2021-03-03 DIAGNOSIS — R5383 Other fatigue: Secondary | ICD-10-CM | POA: Diagnosis not present

## 2021-04-25 DIAGNOSIS — H401131 Primary open-angle glaucoma, bilateral, mild stage: Secondary | ICD-10-CM | POA: Diagnosis not present

## 2021-07-10 DIAGNOSIS — H40059 Ocular hypertension, unspecified eye: Secondary | ICD-10-CM | POA: Diagnosis not present

## 2021-07-10 DIAGNOSIS — R030 Elevated blood-pressure reading, without diagnosis of hypertension: Secondary | ICD-10-CM | POA: Diagnosis not present

## 2021-07-10 DIAGNOSIS — Z881 Allergy status to other antibiotic agents status: Secondary | ICD-10-CM | POA: Diagnosis not present

## 2021-07-10 DIAGNOSIS — Z91030 Bee allergy status: Secondary | ICD-10-CM | POA: Diagnosis not present

## 2021-07-10 DIAGNOSIS — K219 Gastro-esophageal reflux disease without esophagitis: Secondary | ICD-10-CM | POA: Diagnosis not present

## 2021-07-10 DIAGNOSIS — Z882 Allergy status to sulfonamides status: Secondary | ICD-10-CM | POA: Diagnosis not present

## 2021-07-10 DIAGNOSIS — Z833 Family history of diabetes mellitus: Secondary | ICD-10-CM | POA: Diagnosis not present

## 2021-09-12 DIAGNOSIS — H5213 Myopia, bilateral: Secondary | ICD-10-CM | POA: Diagnosis not present

## 2021-09-20 DIAGNOSIS — Z1231 Encounter for screening mammogram for malignant neoplasm of breast: Secondary | ICD-10-CM | POA: Diagnosis not present

## 2021-10-30 DIAGNOSIS — Z0001 Encounter for general adult medical examination with abnormal findings: Secondary | ICD-10-CM | POA: Diagnosis not present

## 2021-10-30 DIAGNOSIS — R739 Hyperglycemia, unspecified: Secondary | ICD-10-CM | POA: Diagnosis not present

## 2021-10-30 DIAGNOSIS — N393 Stress incontinence (female) (male): Secondary | ICD-10-CM | POA: Diagnosis not present

## 2021-10-30 DIAGNOSIS — Z13220 Encounter for screening for lipoid disorders: Secondary | ICD-10-CM | POA: Diagnosis not present

## 2021-10-30 DIAGNOSIS — Z0000 Encounter for general adult medical examination without abnormal findings: Secondary | ICD-10-CM | POA: Diagnosis not present

## 2021-10-30 DIAGNOSIS — E559 Vitamin D deficiency, unspecified: Secondary | ICD-10-CM | POA: Diagnosis not present

## 2021-11-21 ENCOUNTER — Ambulatory Visit: Payer: No Typology Code available for payment source

## 2021-11-23 ENCOUNTER — Encounter: Payer: Self-pay | Admitting: Physical Therapy

## 2021-11-23 ENCOUNTER — Other Ambulatory Visit: Payer: Self-pay

## 2021-11-23 ENCOUNTER — Ambulatory Visit: Payer: Medicare HMO | Attending: Physician Assistant | Admitting: Physical Therapy

## 2021-11-23 DIAGNOSIS — N393 Stress incontinence (female) (male): Secondary | ICD-10-CM | POA: Insufficient documentation

## 2021-11-23 DIAGNOSIS — R293 Abnormal posture: Secondary | ICD-10-CM

## 2021-11-23 DIAGNOSIS — R279 Unspecified lack of coordination: Secondary | ICD-10-CM

## 2021-11-23 DIAGNOSIS — M6281 Muscle weakness (generalized): Secondary | ICD-10-CM

## 2021-11-23 NOTE — Therapy (Signed)
OUTPATIENT PHYSICAL THERAPY FEMALE PELVIC EVALUATION   Patient Name: PATCHES MONTECINO MRN: [id number] DOB:1951/01/02, 71 y.o., female Today's Date: 11/23/2021   PT End of Session - 11/23/21 1301     Visit Number 1    Date for PT Re-Evaluation 02/20/22    Authorization Type aetna medicare    PT Start Time 1135    PT Stop Time 1220    PT Time Calculation (min) 45 min    Activity Tolerance Patient tolerated treatment well    Behavior During Therapy [facility] for tasks assessed/performed             History reviewed. No pertinent past medical history. History reviewed. No pertinent surgical history. There are no problems to display for this patient.   PCP: Seward Carol, MD  REFERRING PROVIDER: Ramond Dial, PA  REFERRING DIAG: N39.3 (ICD-10-CM) - Stress incontinence (female) (female)  THERAPY DIAG:  Muscle weakness (generalized)  Abnormal posture  Unspecified lack of coordination  ONSET DATE: A few years, worsening  SUBJECTIVE:                                                                                                                                                                                           SUBJECTIVE STATEMENT: Pt reports she has a "few years" history of urinary incontinence with sneezing, laughing, coughing, walking for a long distance.  Fluid intake: Yes: 60-70 oz per day at least    Patient confirms identification and approves PT to assess pelvic floor and treatment Yes  PERTINENT HISTORY:  Back surgery at lower lumbar vertebra 2008  Sexual abuse: No  PAIN:  Are you having pain? No    BOWEL MOVEMENT Pain with bowel movement: No Type of bowel movement:Type (Bristol Stool Scale) 4  2-3 per day, no straining Fully empty rectum: No Leakage: No Pads: No Fiber supplement: No  URINATION Pain with urination: No Fully empty bladder: Yes:   Stream: Strong Urgency: No Frequency: not quicker than every 2 hours at least Leakage:  Coughing, Sneezing, Laughing, Exercise, and Lifting Pads: No  INTERCOURSE Pain with intercourse:  dryness noted Ability to have vaginal penetration:  No Types of stimulation: no pain Climax: No Marinoff Scale 0  PREGNANCY Vaginal deliveries 3 Tearing Yes: Episiotomies x3 C-section deliveries 0 Currently pregnant No  PROLAPSE None  PRECAUTIONS: None  WEIGHT BEARING RESTRICTIONS No  FALLS:  Has patient fallen in last 6 months? No, Number of falls: 0  LIVING ENVIRONMENT: Lives with: lives with their family and lives with their spouse Lives in: House/apartment  Has following equipment at home: None  OCCUPATION: retired, like painting and working  out   PLOF: Independent  PATIENT GOALS :to have less leakage   OBJECTIVE:    PATIENT SURVEYS:  UGI - 25    COGNITION:  Overall cognitive status: Within functional limits for tasks assessed     SENSATION:  Light touch: Appears intact  Proprioception: Appears intact  MUSCLE LENGTH: Bil hamstrings and adductors limited by 25%  POSTURE:  Rounded shoulders, posterior pelvic tilt  PALPATION: Internal Pelvic Floor no TTP  External Perineal Exam WFL, no TTP mild dryness noted  GENERAL WFL  LUMBARAROM/PROM  A/PROM A/PROM  11/23/2021  Flexion Limited by 25%  Extension WFL  Right lateral flexion WFL  Left lateral flexion WFL  Right rotation WFL  Left rotation WFL   (Blank rows = not tested)  LE AROM/PROM: all WFL       LE MMT:  MMT Right 11/23/2021 Left 11/23/2021  Hip flexion 4/5 4/5  Hip extension 4/5 4/5  Hip abduction 4-/5 4-/5  Hip adduction 4/5 4/5  Hip internal rotation 4/5 4/5  Hip external rotation 4/5 4/5  Knee flexion 5/5 5/5  Knee extension 5/5 5/5  Ankle dorsiflexion 5/5 5/5  Ankle plantarflexion 5/5 5/5  Ankle inversion 5/5 5/5  Ankle eversion 5/5 5/5   PELVIC MMT:   MMT  11/23/2021  Vaginal 3/5; isometric hold for 7s; 6 reps  Internal Anal Sphincter   External Anal Sphincter    Puborectalis   Diastasis Recti   (Blank rows = not tested)   TONE: WFL  PROLAPSE: none    TODAY'S TREATMENT  EVAL pt educated on exam findings, POC, HEP, vaginal moisturizers and lubricants, and the knack method   PATIENT EDUCATION:  Education details: handouts given on HEP, vaginal moisturizers and lubricants, and the knack method Person educated: Patient Education method: Consulting civil engineer, Demonstration, Tactile cues, Verbal cues, and Handouts Education comprehension: verbalized understanding, returned demonstration, and verbal cues required   HOME EXERCISE PROGRAM: [id number]  ASSESSMENT:  CLINICAL IMPRESSION: Patient is a 71 y.o. female who was seen today for physical therapy evaluation and treatment for urinary incontinence with stressors of laughing, sneezing, coughing. Pt reports she is very active and goes to a few workout classes each week. Pt reports she doesn't have leakage consistently with these stressors but does happen sometimes and wanted to address this now. Pt found to have mild weakness in bil hips, no ROM deficits or abdominal restrictions noted at eval, no pain reported, and pt denied any concerns with bowel health or habits. Pt consented to internal pelvic floor assessment vaginally and found to have decreased strength,  coordination and endurance. Pt also required cues and extra time to decrease compensatory strategies with holding breath, use of core and glutes with recruitment of pelvic floor. Pt did demonstrate improvement with isolation of breathing and improved coordination of breathing with pelvic floor with these cues and extra time. Pt tolerated well and denied additional questions after given handouts and PT going over moisturizers, lubricants, and HEP. Pt would benefit from additional PT to further address deficits.     OBJECTIVE IMPAIRMENTS decreased coordination, decreased endurance, decreased strength, impaired flexibility, improper body mechanics, and  postural dysfunction.   ACTIVITY LIMITATIONS community activity and yard work.   PERSONAL FACTORS Time since onset of injury/illness/exacerbation and 1 comorbidity: x3 vaginal births with episiotomies   are also affecting patient's functional outcome.    REHAB POTENTIAL: Good  CLINICAL DECISION MAKING: Stable/uncomplicated  EVALUATION COMPLEXITY: Low   GOALS: Goals reviewed with patient? Yes  SHORT TERM GOALS:  STG Name Target Date Goal status  1 Pt to be I with HEP.  Baseline:  12/21/2021 INITIAL  2 Pt to demonstrate at least 4/5 pelvic floor strength and ability to hold for 8s for decrease leakage.  Baseline:  12/21/2021 INITIAL  3  Baseline:    4  Baseline:    5  Baseline:    6  Baseline:    7  Baseline:     LONG TERM GOALS:   LTG Name Target Date Goal status  1 Pt to be I with advanced HEP.  Baseline: 02/20/22 INITIAL  2 Pt to demonstrate at least 5/5 bil hip strength for improved pelvic stability and functional squats without leakage.  Baseline: 02/20/22 INITIAL  3 Pt to demonstrate at least 5/5 pelvic floor strength and ability to hold contraction for 10s for improved pelvic stability and decreased strain at pelvic floor/ decrease leakage.  Baseline: 02/20/22 INITIAL  4 Pt to report no more than 1 instance of urinary leakage per month for decrease symptoms and improved QOL.  Baseline: 02/20/22 INITIAL  5  Baseline:    6  Baseline:    7  Baseline:     PLAN: PT FREQUENCY:  1x per week every other week  PT DURATION:  8 sessions  PLANNED INTERVENTIONS: Therapeutic exercises, Therapeutic activity, Neuro Muscular re-education, Balance training, Patient/Family education, Joint mobilization, scar mobilization, Taping, Biofeedback, and Manual therapy  PLAN FOR NEXT SESSION: go over handouts, pelvic floor and breathing coordination with exercises   No emotional/communication barriers or cognitive limitation. Patient is motivated to learn. Patient understands and  agrees with treatment goals and plan. PT explains patient will be examined in standing, sitting, and lying down to see how their muscles and joints work. When they are ready, they will be asked to remove their underwear so PT can examine their perineum. The patient is also given the option of providing their own chaperone as one is not provided in our facility. The patient also has the right and is explained the right to defer or refuse any part of the evaluation or treatment including the internal exam. With the patient's consent, PT will use one gloved finger to gently assess the muscles of the pelvic floor, seeing how well it contracts and relaxes and if there is muscle symmetry. After, the patient will get dressed and PT and patient will discuss exam findings and plan of care. PT and patient discuss plan of care, schedule, attendance policy and HEP activities.   Stacy Gardner, PT, DPT 02/23/231:03 PM

## 2021-11-23 NOTE — Patient Instructions (Signed)
Moisturizers ?They are used in the vagina to hydrate the mucous membrane that make up the vaginal canal. ?Designed to keep a more normal acid balance (ph) ?Once placed in the vagina, it will last between two to three days.  ?Use 2-3 times per week at bedtime  ?Ingredients to avoid is glycerin and fragrance, can increase chance of infection ?Should not be used just before sex due to causing irritation ?Most are gels administered either in a tampon-shaped applicator or as a vaginal suppository. They are non-hormonal. ? ? ?Types of Moisturizers(internal use) ? ?Vitamin E vaginal suppositories- Whole foods, Amazon ?Moist Again ?Coconut oil- can break down condoms ?Julva- (Do no use if on Tamoxifen) amazon ?Yes moisturizer- amazon ?NeuEve Silk , NeuEve Silver for menopausal or over 65 (if have severe vaginal atrophy or cancer treatments use NeuEve Silk for  1 month than move to NeuEve Silver)- Amazon, Neuve.com ?Olive and Bee intimate cream- www.oliveandbee.com.au ?Mae vaginal moisturizer- Amazon ?Aloe ? ? ? ?Creams to use externally on the Vulva area ?Desert Harvest Releveum (good for for cancer patients that had radiation to the area)- amazon or www.desertharvest.com ?V-magic cream - amazon ?Julva-amazon ?Vital "V Wild Yam salve ( help moisturize and help with thinning vulvar area, does have Beeswax ?MoodMaid Botanical Pro-Meno Wild Yam Cream- Amazon ?Desert Harvest Gele ?Cleo by Damiva labial moisturizer (Amazon,  ?Coconut or olive oil ?aloe ? ? ?Things to avoid in the vaginal area ?Do not use things to irritate the vulvar area ?No lotions just specialized creams for the vulva area- Neogyn, V-magic, No soaps; can use Aveeno or Calendula cleanser if needed. Must be gentle ?No deodorants ?No douches ?Good to sleep without underwear to let the vaginal area to air out ?No scrubbing: spread the lips to let warm water rinse over labias and pat dry ? ? ? ?Lubrication ?Used for intercourse to reduce friction ?Avoid ones that  have glycerin, nonoxynol-9, petroleum, propylene glycol, chlorhexidine gluconate, warming gels, tingling gels, icing or cooling gel, scented ?Avoid parabens due to a preservative similar to female sex hormone ?May need to be reapplied once or several times during sexual activity ?Can be applied to both partners genitals prior to vaginal penetration to minimize friction or irritation ?Prevent irritation and mucosal tears that cause post coital pain and increased the risk of vaginal and urinary tract infections ?Oil-based lubricants cannot be used with condoms due to breaking them down.  Least likely to irritate vaginal tissue.  ?Plant based-lubes are safe ?Silicone-based lubrication are thicker and last long and used for post-menopausal women ? ?Vaginal Lubricators ?Here is a list of some suggested lubricators you can use for intercourse. Use the most hypoallergenic product.  You can place on you or your partner.  ?Slippery Stuff ( water based) ?Sylk or Sliquid Natural H2O ( good  if frequent UTI?s)- walmart, amazon ?Sliquid organics silk-(aloe and silicone based ) ?Blossom Organics (www.blossom-organics.com)- (aloe based ) ?Coconut oil, olive oil -not good with condoms  ?PJur Woman Nude- (water based) amazon ?Uberlube- ( silicon) Amazon ?Aloe Vera- Sprouts has an organic one ?Yes lubricant- (water based and has plant oil based similar to silicone) Amazon ?Wet Platinum-Silicone, Target, [facility]greens ?Olive and Bee intimate cream-  www.oliveandbee.com.au ?Pink - Amazon ?Wet stuff ?Erosense Sync- walmart, amazon ?Coconu- coconu.com ? ?Things to avoid in lubricants are glycerin, warming gels, tingling gels, icing or cooling ? gels, and scented gels.  Also avoid Vaseline. ?KY jelly, Replens, and Astroglide contain chlorhexidine which kills good bacteria(lactobacilli) ? ?Things to avoid in   the vaginal area ?Do not use things to irritate the vulvar area ?No lotions- see below ?Soaps you  can use :Aveeno, Calendula, Good Clean  Love cleanser if needed. Must be gentle ?No deodorants ?No douches ?Good to sleep without underwear to let the vaginal area to air out ?No scrubbing: spread the lips to let warm water rinse over labias and pat dry ? ?Creams that can be used on the Vulva Area ?V magic-amazon, walmart ?Vital V Wild Yam Salve ?Julva- Amazon ?MoonMaid Botanical Pro-Meno Wild Yam Cream ?Coconut oil, olive oil ?Cleo by Damiva labial moisturizer -Amazon,  ?Desert Havest Releveum ( lidocaine) or Desert Harvest Gele ?Yes Moisturizer ? ?   ?THE KNACK ? ?The Knack is a strategy you may use to help to reduce or prevent leakage or passing of urine, gas or feces during an activity that causes downward force on the pelvic floor muscles.   ? ?Activities that can cause downward pressure on the pelvic floor muscles include coughing, sneezing, laughing, bending, lifting, and transitioning from different body positions such as from laying down to sitting up and sitting to standing. ? ?To perform The Knack, consciously squeeze and lift your pelvic floor muscles to perform a strong, well-timed pelvic muscle contraction BEFORE AND DURING these activities above.  As your contraction gets more coordinated and your muscles get stronger, you will become more effective in controlling your experience of incontinence or gas passing during these activities.   ? ? ? ?

## 2021-12-07 ENCOUNTER — Other Ambulatory Visit: Payer: Self-pay

## 2021-12-07 ENCOUNTER — Ambulatory Visit: Payer: Medicare HMO | Attending: Physician Assistant | Admitting: Physical Therapy

## 2021-12-07 DIAGNOSIS — R293 Abnormal posture: Secondary | ICD-10-CM

## 2021-12-07 DIAGNOSIS — M6281 Muscle weakness (generalized): Secondary | ICD-10-CM | POA: Insufficient documentation

## 2021-12-07 DIAGNOSIS — R279 Unspecified lack of coordination: Secondary | ICD-10-CM | POA: Insufficient documentation

## 2021-12-07 NOTE — Therapy (Signed)
?OUTPATIENT PHYSICAL THERAPY TREATMENT NOTE ? ? ?Patient Name: Allison Rangel ?MRN: [id number] ?DOB:[birth year], 71 y.o., female ?Today's Date: 12/07/2021 ? ?PCP: Renford Dills, MD ?REFERRING PROVIDER: Renford Dills, MD ? ? PT End of Session - 12/07/21 1106   ? ? Visit Number 2   ? Date for PT Re-Evaluation 02/20/22   ? Authorization Type aetna medicare   ? PT Start Time 1103   ? PT Stop Time 1145   ? PT Time Calculation (min) 42 min   ? Activity Tolerance Patient tolerated treatment well   ? Behavior During Therapy [facility] for tasks assessed/performed   ? ?  ?  ? ?  ? ? ?No past medical history on file. ?No past surgical history on file. ?There are no problems to display for this patient. ? ? ?REFERRING DIAG: N39.3 (ICD-10-CM) - Stress incontinence (female) (female) ? ?THERAPY DIAG:  ?Muscle weakness (generalized) ? ?Abnormal posture ? ?Unspecified lack of coordination ? ?PERTINENT HISTORY: Back surgery at lower lumbar vertebra 2008  ?Sexual abuse: No ? ?PRECAUTIONS: None ? ?SUBJECTIVE: Pt reports a couple instances of "a few drops" of urine incontinence with strong sneeze.  ? ?PAIN:  ?Are you having pain? No ? ? ? ? ?PATIENT SURVEYS:  ?UGI - 25 ?  ?  ?  ?COGNITION: ?           Overall cognitive status: Within functional limits for tasks assessed              ?            ?SENSATION: ?           Light touch: Appears intact ?           Proprioception: Appears intact ?  ?MUSCLE LENGTH: ?Bil hamstrings and adductors limited by 25% ?  ?POSTURE:  ?Rounded shoulders, posterior pelvic tilt ?  ?PALPATION: ?Internal Pelvic Floor no TTP ?  ?External Perineal Exam WFL, no TTP mild dryness noted ?  ?GENERAL WFL ?  ?LUMBARAROM/PROM ?  ?A/PROM A/PROM  ?11/23/2021  ?Flexion Limited by 25%  ?Extension WFL  ?Right lateral flexion WFL  ?Left lateral flexion WFL  ?Right rotation WFL  ?Left rotation WFL  ? (Blank rows = not tested) ?  ?LE AROM/PROM: all WFL      ?  ?LE MMT: ?  ?MMT Right ?11/23/2021 Left ?11/23/2021  ?Hip flexion 4/5 4/5   ?Hip extension 4/5 4/5  ?Hip abduction 4-/5 4-/5  ?Hip adduction 4/5 4/5  ?Hip internal rotation 4/5 4/5  ?Hip external rotation 4/5 4/5  ?Knee flexion 5/5 5/5  ?Knee extension 5/5 5/5  ?Ankle dorsiflexion 5/5 5/5  ?Ankle plantarflexion 5/5 5/5  ?Ankle inversion 5/5 5/5  ?Ankle eversion 5/5 5/5  ?  ?PELVIC MMT: ?  ?MMT   ?11/23/2021  ?Vaginal 3/5; isometric hold for 7s; 6 reps  ?Internal Anal Sphincter    ?External Anal Sphincter    ?Puborectalis    ?Diastasis Recti    ?(Blank rows = not tested) ?  ?  ?TONE: ?WFL ?  ?PROLAPSE: ?none ?  ?  ?  ?TODAY'S TREATMENT  ?*EVAL pt educated on exam findings, POC, HEP, vaginal moisturizers and lubricants, and the knack method ? ?* 12/07/2021 : hooklying core exercises: 2x10 TA activation with max verbal and tactile cues, 2x10 90 heel touch downs; 2x10 ball squeeze with exhale and TA activation; 2x10 opp arm/knee press with exhale ?Standing: squats 10# kettle bell 2x10; mario punches 2x10 5# dumbbell; palloff red band 2x10;  lunges x10 ? ?  ?PATIENT EDUCATION:  ?Education details:  HEP ?Person educated: Patient ?Education method: Explanation, Demonstration, Tactile cues, Verbal cues, and Handouts ?Education comprehension: verbalized understanding, returned demonstration, and verbal cues required ?  ?  ?HOME EXERCISE PROGRAM: ?[id number] ?  ?ASSESSMENT: ?  ?CLINICAL IMPRESSION: ?Pt presents to clinic reporting a few instances of urinary incontinence but very small amounts. Pt session focused on core activation with coordination with breathing and with strengthening exercises for decreased leakage and improved coordination of pelvic floor. Pt would benefit from additional PT to further address deficits.  ?  ?  ?  ?OBJECTIVE IMPAIRMENTS decreased coordination, decreased endurance, decreased strength, impaired flexibility, improper body mechanics, and postural dysfunction.  ?  ?ACTIVITY LIMITATIONS community activity and yard work.  ?  ?PERSONAL FACTORS Time since onset of  injury/illness/exacerbation and 1 comorbidity: x3 vaginal births with episiotomies   are also affecting patient's functional outcome.  ?  ?  ?REHAB POTENTIAL: Good ?  ?CLINICAL DECISION MAKING: Stable/uncomplicated ?  ?EVALUATION COMPLEXITY: Low ?  ?  ?GOALS: ?Goals reviewed with patient? Yes ?  ?SHORT TERM GOALS: ?  ?STG Name Target Date Goal status  ?1 Pt to be I with HEP. ?  ?Baseline:  12/21/2021 INITIAL  ?2 Pt to demonstrate at least 4/5 pelvic floor strength and ability to hold for 8s for decrease leakage.  ?Baseline:  12/21/2021 INITIAL  ?3   ?Baseline:      ?4   ?Baseline:      ?5   ?Baseline:      ?6   ?Baseline:      ?7   ?Baseline:      ?  ?LONG TERM GOALS:  ?  ?LTG Name Target Date Goal status  ?1 Pt to be I with advanced HEP. ?  ?Baseline: 02/20/22 INITIAL  ?2 Pt to demonstrate at least 5/5 bil hip strength for improved pelvic stability and functional squats without leakage.  ?Baseline: 02/20/22 INITIAL  ?3 Pt to demonstrate at least 5/5 pelvic floor strength and ability to hold contraction for 10s for improved pelvic stability and decreased strain at pelvic floor/ decrease leakage.  ?Baseline: 02/20/22 INITIAL  ?4 Pt to report no more than 1 instance of urinary leakage per month for decrease symptoms and improved QOL.  ?Baseline: 02/20/22 INITIAL  ?5   ?Baseline:      ?6   ?Baseline:      ?7   ?Baseline:      ?  ?PLAN: ?PT FREQUENCY:  1x per week every other week ?  ?PT DURATION:  8 sessions ?  ?PLANNED INTERVENTIONS: Therapeutic exercises, Therapeutic activity, Neuro Muscular re-education, Balance training, Patient/Family education, Joint mobilization, scar mobilization, Taping, Biofeedback, and Manual therapy ?  ?PLAN FOR NEXT SESSION: go over handouts, pelvic floor and breathing coordination with exercises ?  ?  ?No emotional/communication barriers or cognitive limitation. Patient is motivated to learn. Patient understands and agrees with treatment goals and plan. PT explains patient will be examined in  standing, sitting, and lying down to see how their muscles and joints work. When they are ready, they will be asked to remove their underwear so PT can examine their perineum. The patient is also given the option of providing their own chaperone as one is not provided in our facility. The patient also has the right and is explained the right to defer or refuse any part of the evaluation or treatment including the internal exam. With the patient's consent, PT will use one  gloved finger to gently assess the muscles of the pelvic floor, seeing how well it contracts and relaxes and if there is muscle symmetry. After, the patient will get dressed and PT and patient will discuss exam findings and plan of care. PT and patient discuss plan of care, schedule, attendance policy and HEP activities.  ? ? ? ?Otelia Sergeant, PT, DPT ?12/07/2309:45 AM  ? ?  ? ?

## 2021-12-18 ENCOUNTER — Encounter: Payer: No Typology Code available for payment source | Admitting: Physical Therapy

## 2021-12-28 ENCOUNTER — Ambulatory Visit: Payer: Medicare HMO | Admitting: Physical Therapy

## 2021-12-28 DIAGNOSIS — R293 Abnormal posture: Secondary | ICD-10-CM | POA: Diagnosis not present

## 2021-12-28 DIAGNOSIS — R279 Unspecified lack of coordination: Secondary | ICD-10-CM | POA: Diagnosis not present

## 2021-12-28 DIAGNOSIS — M6281 Muscle weakness (generalized): Secondary | ICD-10-CM | POA: Diagnosis not present

## 2021-12-28 NOTE — Therapy (Signed)
?OUTPATIENT PHYSICAL THERAPY TREATMENT NOTE ? ? ?Patient Name: Allison Rangel ?MRN: [id number] ?DOB:[birth year], 71 y.o., female ?Today's Date: 12/28/2021 ? ?PCP: Seward Carol, MD ?REFERRING PROVIDER: Seward Carol, MD ? ? ? ? ?No past medical history on file. ?No past surgical history on file. ?There are no problems to display for this patient. ? ? ?REFERRING DIAG: N39.3 (ICD-10-CM) - Stress incontinence (female) (female) ? ?THERAPY DIAG:  ?No diagnosis found. ? ?PERTINENT HISTORY: Back surgery at lower lumbar vertebra 2008  ?Sexual abuse: No ? ?PRECAUTIONS: None ? ?SUBJECTIVE: Pt reports she feels she is 75% better  since starting PT with less amounts of leakage and less often.  ? ?PAIN:  ?Are you having pain? No ? ? ? ? ?PATIENT SURVEYS:  ?UDI (Urinary Distress Inventory) - 2 ?  ?  ?  ?COGNITION: ?           Overall cognitive status: Within functional limits for tasks assessed              ?            ?SENSATION: ?           Light touch: Appears intact ?           Proprioception: Appears intact ?  ?MUSCLE LENGTH: ?Bil hamstrings and adductors limited by 25% ?  ?POSTURE:  ?Rounded shoulders, posterior pelvic tilt ?  ?PALPATION: ?Internal Pelvic Floor no TTP ?  ?External Perineal Exam WFL, no TTP mild dryness noted ?  ?GENERAL WFL ?  ?LUMBARAROM/PROM ?  ?A/PROM A/PROM  ?11/23/2021  ?Flexion Limited by 25%  ?Extension WFL  ?Right lateral flexion WFL  ?Left lateral flexion WFL  ?Right rotation WFL  ?Left rotation WFL  ? (Blank rows = not tested) ?  ?LE AROM/PROM: all WFL      ?  ?LE MMT: ?  ?MMT Right ?11/23/2021 Left ?11/23/2021  ?Hip flexion 4/5 4/5  ?Hip extension 4/5 4/5  ?Hip abduction 4-/5 4-/5  ?Hip adduction 4/5 4/5  ?Hip internal rotation 4/5 4/5  ?Hip external rotation 4/5 4/5  ?Knee flexion 5/5 5/5  ?Knee extension 5/5 5/5  ?Ankle dorsiflexion 5/5 5/5  ?Ankle plantarflexion 5/5 5/5  ?Ankle inversion 5/5 5/5  ?Ankle eversion 5/5 5/5  ?  ?PELVIC MMT: ?  ?MMT   ?11/23/2021 12/28/21  ?Vaginal 3/5; isometric hold  for 7s; 6 reps 5/5; isometric 10s; 9 reps  ?Internal Anal Sphincter     ?External Anal Sphincter     ?Puborectalis     ?Diastasis Recti     ?(Blank rows = not tested) ?  ?  ?TONE: ?WFL ?  ?PROLAPSE: ?none ?  ?  ?  ?TODAY'S TREATMENT  ?*EVAL pt educated on exam findings, POC, HEP, vaginal moisturizers and lubricants, and the knack method ? ?* 12/07/2021 : hooklying core exercises: 2x10 TA activation with max verbal and tactile cues, 2x10 90 heel touch downs; 2x10 ball squeeze with exhale and TA activation; 2x10 opp arm/knee press with exhale ?Standing: squats 10# kettle bell 2x10; mario punches 2x10 5# dumbbell; palloff red band 2x10; lunges x10 ? ?12/28/2021: ?Pt consented to internal vaginal pelvic floor assessment this date for reassessment and found to have improved strength, endurance, and coordination. Pt tolerated well without pain, did have some redness at vulva but reports she had intercourse this morning and usually is a little red after this.  ?During internal treatment: 2x10 pelvic floor contractions, G53 quick flicks, x5 64W holds ?Post internal treatment: pt directed in  updated HEP exercises to insure carry over of coordination of pelvic floor and breathing with core activations and technique for exercises.  ?X8 lunges bil ?X10 90/90 heel taps ?X10 bird dogs ?All others updated on HEP pt reported understanding and denied additional questions.  ? ? ?  ?PATIENT EDUCATION:  ?Education details:  HEP ?Person educated: Patient ?Education method: Explanation, Demonstration, Tactile cues, Verbal cues, and Handouts ?Education comprehension: verbalized understanding, returned demonstration, and verbal cues required ?  ?  ?HOME EXERCISE PROGRAM: ?Access Code: [email or web address] ?URL: [email or web address] ?Date: 12/28/2021 ?Prepared by: [facility] ? ?Exercises ?- Seated Diaphragmatic Breathing  - 1 x daily - 7 x weekly - 1 sets - 10 reps ?- Seated Quick Flick Pelvic  Floor Contractions  - 1 x daily - 7 x weekly - 1 sets - 10 reps ?- Seated Pelvic Floor Muscles Isometrics on Towel Roll  - 1 x daily - 7 x weekly - 1 sets - 10 reps - 10s holds ?- Sit to Stand with Pelvic Floor Contraction  - 1 x daily - 7 x weekly - 1 sets - 10 reps ?- Seated Cough with Pelvic Floor Contraction and Hand to Mouth  - 1 x daily - 7 x weekly - 1 sets - 10 reps ?- Squat  - 1 x daily - 7 x weekly - 1 sets - 10 reps ?- Mini Lunge  - 1 x daily - 7 x weekly - 1 sets - 10 reps ?- Standing Anti-Rotation Press with Anchored Resistance  - 1 x daily - 7 x weekly - 1 sets - 10 reps ?- Standing Diagonal Chop  - 1 x daily - 7 x weekly - 1 sets - 10 reps ?- Hooklying Isometric Hip Flexion  - 1 x daily - 7 x weekly - 1 sets - 10 reps ?- Supine Bridge with Mini Swiss Ball Between Knees  - 1 x daily - 7 x weekly - 1 sets - 10 reps ?- Supine 90/90 Alternating Heel Touches with Posterior Pelvic Tilt  - 1 x daily - 7 x weekly - 1 sets - 10 reps ?- Quadruped Pelvic Floor Contraction with Opposite Arm and Leg Lift  - 1 x daily - 7 x weekly - 1 sets - 10 reps ?  ?ASSESSMENT: ?  ?CLINICAL IMPRESSION: ?Pt presents to clinic reporting 1-2x per week at most with 1-2 drops of urine leakage with strong cough or sneeze only. Pt reports she feels 75% better since starting PT. Pt has met all STG and 2/3 LTG with having 1-2 instances of 1-2 drops of urine leakage a week. Pt pleased with progress and confident in DC today.  ?  ?  ?  ?OBJECTIVE IMPAIRMENTS decreased coordination, decreased endurance, decreased strength, impaired flexibility, improper body mechanics, and postural dysfunction.  ?  ?ACTIVITY LIMITATIONS community activity and yard work.  ?  ?PERSONAL FACTORS Time since onset of injury/illness/exacerbation and 1 comorbidity: x3 vaginal births with episiotomies   are also affecting patient's functional outcome.  ?  ?  ?REHAB POTENTIAL: Good ?  ?CLINICAL DECISION MAKING: Stable/uncomplicated ?  ?EVALUATION COMPLEXITY: Low ?   ?  ?GOALS: ?Goals reviewed with patient? Yes ?  ?SHORT TERM GOALS: ?  ?STG Name Target Date Goal status 12/28/21  ?1 Pt to be I with HEP. ?  ?Baseline:  12/21/2021 INITIAL MET  ?2 Pt to demonstrate at least 4/5 pelvic floor strength and ability to hold for 8s for decrease leakage.  ?  Baseline:  12/21/2021 INITIAL MET  ?3   ?Baseline:       ?4   ?Baseline:       ?5   ?Baseline:       ?6   ?Baseline:       ?7   ?Baseline:       ?  ?LONG TERM GOALS:  ?  ?LTG Name Target Date Goal status 12/28/21  ?1 Pt to be I with advanced HEP. ?  ?Baseline: 02/20/22 INITIAL MET  ?2 Pt to demonstrate at least 5/5 bil hip strength for improved pelvic stability and functional squats without leakage.  ?Baseline: 02/20/22 INITIAL MET  ?3 Pt to demonstrate at least 5/5 pelvic floor strength and ability to hold contraction for 10s for improved pelvic stability and decreased strain at pelvic floor/ decrease leakage.  ?Baseline: 02/20/22 INITIAL MET  ?4 Pt to report no more than 1 instance of urinary leakage per month for decrease symptoms and improved QOL.  ?Baseline: 02/20/22 INITIAL NOT MET  ?5   ?Baseline:       ?6   ?Baseline:       ?7   ?Baseline:       ?  ?PLAN: ?PT FREQUENCY:  1x per week every other week ?  ?PT DURATION:  8 sessions ?  ?PLANNED INTERVENTIONS: Therapeutic exercises, Therapeutic activity, Neuro Muscular re-education, Balance training, Patient/Family education, Joint mobilization, scar mobilization, Taping, Biofeedback, and Manual therapy ?  ?PLAN FOR NEXT SESSION: DC today ?  ?  ?No emotional/communication barriers or cognitive limitation. Patient is motivated to learn. Patient understands and agrees with treatment goals and plan. PT explains patient will be examined in standing, sitting, and lying down to see how their muscles and joints work. When they are ready, they will be asked to remove their underwear so PT can examine their perineum. The patient is also given the option of providing their own chaperone as one is not  provided in our facility. The patient also has the right and is explained the right to defer or refuse any part of the evaluation or treatment including the internal exam. With the patient's consent, P

## 2022-01-02 DIAGNOSIS — H401131 Primary open-angle glaucoma, bilateral, mild stage: Secondary | ICD-10-CM | POA: Diagnosis not present

## 2022-05-15 DIAGNOSIS — H401131 Primary open-angle glaucoma, bilateral, mild stage: Secondary | ICD-10-CM | POA: Diagnosis not present

## 2022-05-31 DIAGNOSIS — S96212A Strain of intrinsic muscle and tendon at ankle and foot level, left foot, initial encounter: Secondary | ICD-10-CM | POA: Diagnosis not present

## 2022-05-31 DIAGNOSIS — K219 Gastro-esophageal reflux disease without esophagitis: Secondary | ICD-10-CM | POA: Diagnosis not present

## 2022-07-11 ENCOUNTER — Encounter (HOSPITAL_COMMUNITY): Payer: Self-pay | Admitting: Emergency Medicine

## 2022-07-11 ENCOUNTER — Inpatient Hospital Stay (HOSPITAL_COMMUNITY)
Admission: EM | Admit: 2022-07-11 | Discharge: 2022-07-13 | DRG: 287 | Disposition: A | Discharge status: Home / Self Care | Payer: Medicare HMO | Attending: Cardiovascular Disease | Admitting: Cardiovascular Disease

## 2022-07-11 DIAGNOSIS — T17920A Food in respiratory tract, part unspecified causing asphyxiation, initial encounter: Secondary | ICD-10-CM | POA: Diagnosis not present

## 2022-07-11 DIAGNOSIS — I5181 Takotsubo syndrome: Secondary | ICD-10-CM | POA: Diagnosis not present

## 2022-07-11 DIAGNOSIS — I959 Hypotension, unspecified: Secondary | ICD-10-CM | POA: Diagnosis not present

## 2022-07-11 DIAGNOSIS — I252 Old myocardial infarction: Secondary | ICD-10-CM

## 2022-07-11 DIAGNOSIS — R109 Unspecified abdominal pain: Secondary | ICD-10-CM | POA: Diagnosis not present

## 2022-07-11 DIAGNOSIS — Z882 Allergy status to sulfonamides status: Secondary | ICD-10-CM

## 2022-07-11 DIAGNOSIS — I2510 Atherosclerotic heart disease of native coronary artery without angina pectoris: Secondary | ICD-10-CM | POA: Diagnosis present

## 2022-07-11 DIAGNOSIS — Z79899 Other long term (current) drug therapy: Secondary | ICD-10-CM

## 2022-07-11 DIAGNOSIS — I214 Non-ST elevation (NSTEMI) myocardial infarction: Principal | ICD-10-CM | POA: Diagnosis present

## 2022-07-11 DIAGNOSIS — R112 Nausea with vomiting, unspecified: Secondary | ICD-10-CM | POA: Diagnosis present

## 2022-07-11 DIAGNOSIS — R079 Chest pain, unspecified: Secondary | ICD-10-CM | POA: Diagnosis not present

## 2022-07-11 DIAGNOSIS — Z91030 Bee allergy status: Secondary | ICD-10-CM

## 2022-07-11 DIAGNOSIS — Z743 Need for continuous supervision: Secondary | ICD-10-CM | POA: Diagnosis not present

## 2022-07-11 DIAGNOSIS — Z888 Allergy status to other drugs, medicaments and biological substances status: Secondary | ICD-10-CM

## 2022-07-11 DIAGNOSIS — I10 Essential (primary) hypertension: Secondary | ICD-10-CM | POA: Diagnosis not present

## 2022-07-11 DIAGNOSIS — E785 Hyperlipidemia, unspecified: Secondary | ICD-10-CM | POA: Diagnosis present

## 2022-07-11 DIAGNOSIS — R7989 Other specified abnormal findings of blood chemistry: Secondary | ICD-10-CM

## 2022-07-11 DIAGNOSIS — Z91041 Radiographic dye allergy status: Secondary | ICD-10-CM

## 2022-07-11 DIAGNOSIS — R0789 Other chest pain: Secondary | ICD-10-CM | POA: Diagnosis not present

## 2022-07-11 NOTE — ED Triage Notes (Signed)
Pt BIB [facility] c/o emesis and chest pain after getting chocked on a piece of toast at about 9pm. Pt called EMS earlier tonight for same but decided not to be seen at that time.

## 2022-07-12 ENCOUNTER — Emergency Department (HOSPITAL_COMMUNITY): Payer: Medicare HMO

## 2022-07-12 ENCOUNTER — Encounter (HOSPITAL_COMMUNITY)
Admission: EM | Disposition: A | Discharge status: Home / Self Care | Payer: Self-pay | Source: Home / Self Care | Attending: Cardiovascular Disease

## 2022-07-12 ENCOUNTER — Observation Stay (HOSPITAL_COMMUNITY): Payer: Medicare HMO

## 2022-07-12 DIAGNOSIS — Z91041 Radiographic dye allergy status: Secondary | ICD-10-CM | POA: Diagnosis not present

## 2022-07-12 DIAGNOSIS — I959 Hypotension, unspecified: Secondary | ICD-10-CM | POA: Diagnosis not present

## 2022-07-12 DIAGNOSIS — Z882 Allergy status to sulfonamides status: Secondary | ICD-10-CM | POA: Diagnosis not present

## 2022-07-12 DIAGNOSIS — R112 Nausea with vomiting, unspecified: Secondary | ICD-10-CM | POA: Diagnosis not present

## 2022-07-12 DIAGNOSIS — R079 Chest pain, unspecified: Secondary | ICD-10-CM | POA: Diagnosis not present

## 2022-07-12 DIAGNOSIS — E785 Hyperlipidemia, unspecified: Secondary | ICD-10-CM | POA: Diagnosis not present

## 2022-07-12 DIAGNOSIS — Z888 Allergy status to other drugs, medicaments and biological substances status: Secondary | ICD-10-CM | POA: Diagnosis not present

## 2022-07-12 DIAGNOSIS — R7989 Other specified abnormal findings of blood chemistry: Secondary | ICD-10-CM | POA: Diagnosis not present

## 2022-07-12 DIAGNOSIS — I214 Non-ST elevation (NSTEMI) myocardial infarction: Secondary | ICD-10-CM | POA: Diagnosis not present

## 2022-07-12 DIAGNOSIS — I5181 Takotsubo syndrome: Secondary | ICD-10-CM | POA: Diagnosis not present

## 2022-07-12 DIAGNOSIS — I2510 Atherosclerotic heart disease of native coronary artery without angina pectoris: Secondary | ICD-10-CM | POA: Diagnosis not present

## 2022-07-12 DIAGNOSIS — I252 Old myocardial infarction: Secondary | ICD-10-CM | POA: Diagnosis not present

## 2022-07-12 DIAGNOSIS — Z91030 Bee allergy status: Secondary | ICD-10-CM | POA: Diagnosis not present

## 2022-07-12 DIAGNOSIS — Z79899 Other long term (current) drug therapy: Secondary | ICD-10-CM | POA: Diagnosis not present

## 2022-07-12 HISTORY — PX: LEFT HEART CATH AND CORONARY ANGIOGRAPHY: CATH118249

## 2022-07-12 LAB — LIPASE, BLOOD: Lipase: 36 U/L (ref 11–51)

## 2022-07-12 LAB — CBC
HCT: 45.3 % (ref 36.0–46.0)
Hemoglobin: 15.6 g/dL — ABNORMAL HIGH (ref 12.0–15.0)
MCH: 31 pg (ref 26.0–34.0)
MCHC: 34.4 g/dL (ref 30.0–36.0)
MCV: 89.9 fL (ref 80.0–100.0)
Platelets: 213 10*3/uL (ref 150–400)
RBC: 5.04 MIL/uL (ref 3.87–5.11)
RDW: 12.9 % (ref 11.5–15.5)
WBC: 5.7 10*3/uL (ref 4.0–10.5)
nRBC: 0 % (ref 0.0–0.2)

## 2022-07-12 LAB — HEPATIC FUNCTION PANEL
ALT: 28 U/L (ref 0–44)
AST: 23 U/L (ref 15–41)
Albumin: 4.7 g/dL (ref 3.5–5.0)
Alkaline Phosphatase: 73 U/L (ref 38–126)
Bilirubin, Direct: <0.1 mg/dL (ref 0.0–0.2)
Total Bilirubin: 1.1 mg/dL (ref 0.3–1.2)
Total Protein: 8.1 g/dL (ref 6.5–8.1)

## 2022-07-12 LAB — BASIC METABOLIC PANEL
Anion gap: 13 (ref 5–15)
BUN: 18 mg/dL (ref 8–23)
CO2: 22 mmol/L (ref 22–32)
Calcium: 9.5 mg/dL (ref 8.9–10.3)
Chloride: 98 mmol/L (ref 98–111)
Creatinine, Ser: 0.67 mg/dL (ref 0.44–1.00)
GFR, Estimated: >60 mL/min (ref >60)
Glucose, Bld: 184 mg/dL — ABNORMAL HIGH (ref 70–99)
Potassium: 3.5 mmol/L (ref 3.5–5.1)
Sodium: 133 mmol/L — ABNORMAL LOW (ref 135–145)

## 2022-07-12 LAB — TROPONIN I (HIGH SENSITIVITY)
Troponin I (High Sensitivity): 1378 ng/L (ref <18)
Troponin I (High Sensitivity): 4098 ng/L (ref <18)

## 2022-07-12 LAB — HEPARIN LEVEL (UNFRACTIONATED): Heparin Unfractionated: 0.25 IU/mL — ABNORMAL LOW (ref 0.30–0.70)

## 2022-07-12 SURGERY — LEFT HEART CATH AND CORONARY ANGIOGRAPHY
Anesthesia: LOCAL

## 2022-07-12 MED ORDER — ALUM & MAG HYDROXIDE-SIMETH 200-200-20 MG/5ML PO SUSP
30.0000 mL | Freq: Once | ORAL | Status: AC
  Administered 2022-07-12: 30 mL via ORAL
  Filled 2022-07-12: qty 30

## 2022-07-12 MED ORDER — ONDANSETRON HCL 4 MG/2ML IJ SOLN
4.0000 mg | Freq: Once | INTRAMUSCULAR | Status: AC
  Administered 2022-07-12: 4 mg via INTRAVENOUS
  Filled 2022-07-12: qty 2

## 2022-07-12 MED ORDER — METHYLPREDNISOLONE SODIUM SUCC 40 MG IJ SOLR
40.0000 mg | INTRAMUSCULAR | Status: DC
  Administered 2022-07-12: 40 mg via INTRAVENOUS
  Filled 2022-07-12: qty 1

## 2022-07-12 MED ORDER — SODIUM CHLORIDE 0.9% FLUSH: 3.0000 mL | INTRAVENOUS | Status: DC | PRN

## 2022-07-12 MED ORDER — ACETAMINOPHEN 325 MG PO TABS
650.0000 mg | ORAL_TABLET | ORAL | Status: DC | PRN
  Administered 2022-07-12 – 2022-07-13 (×2): 650 mg via ORAL
  Filled 2022-07-12 (×2): qty 2

## 2022-07-12 MED ORDER — LABETALOL HCL 5 MG/ML IV SOLN: 10.0000 mg | INTRAVENOUS | Status: AC | PRN

## 2022-07-12 MED ORDER — HEPARIN BOLUS VIA INFUSION
3000.0000 [IU] | Freq: Once | INTRAVENOUS | Status: AC
  Administered 2022-07-12: 3000 [IU] via INTRAVENOUS

## 2022-07-12 MED ORDER — NITROGLYCERIN IN D5W 200-5 MCG/ML-% IV SOLN
5.0000 ug/min | INTRAVENOUS | Status: DC
  Administered 2022-07-12: 5 ug/min via INTRAVENOUS
  Filled 2022-07-12: qty 250

## 2022-07-12 MED ORDER — HEPARIN SODIUM (PORCINE) 1000 UNIT/ML IJ SOLN
INTRAMUSCULAR | Status: AC
  Filled 2022-07-12: qty 10

## 2022-07-12 MED ORDER — HEPARIN (PORCINE) 25000 UT/250ML-% IV SOLN
850.0000 [IU]/h | INTRAVENOUS | Status: DC
  Administered 2022-07-12: 750 [IU]/h via INTRAVENOUS
  Filled 2022-07-12: qty 250

## 2022-07-12 MED ORDER — METHYLPREDNISOLONE SODIUM SUCC 125 MG IJ SOLR
80.0000 mg | Freq: Once | INTRAMUSCULAR | Status: AC
  Administered 2022-07-12: 80 mg via INTRAVENOUS
  Filled 2022-07-12: qty 2

## 2022-07-12 MED ORDER — ACETAMINOPHEN 325 MG PO TABS: 650.0000 mg | ORAL_TABLET | ORAL | Status: DC | PRN

## 2022-07-12 MED ORDER — PANTOPRAZOLE SODIUM 40 MG IV SOLR
40.0000 mg | Freq: Once | INTRAVENOUS | Status: AC
Start: 2022-07-12 — End: 2022-07-12
  Administered 2022-07-12: 40 mg via INTRAVENOUS
  Filled 2022-07-12: qty 10

## 2022-07-12 MED ORDER — SODIUM CHLORIDE 0.9 % WEIGHT BASED INFUSION: 1.0000 mL/kg/h | INTRAVENOUS | Status: DC

## 2022-07-12 MED ORDER — SODIUM CHLORIDE 0.9 % WEIGHT BASED INFUSION
1.0000 mL/kg/h | INTRAVENOUS | Status: DC
  Administered 2022-07-12: 1 mL/kg/h via INTRAVENOUS

## 2022-07-12 MED ORDER — HEPARIN SODIUM (PORCINE) 1000 UNIT/ML IJ SOLN
INTRAMUSCULAR | Status: DC | PRN
  Administered 2022-07-12: 3500 [IU] via INTRAVENOUS

## 2022-07-12 MED ORDER — FENTANYL CITRATE (PF) 100 MCG/2ML IJ SOLN
INTRAMUSCULAR | Status: AC
  Filled 2022-07-12: qty 2

## 2022-07-12 MED ORDER — ONDANSETRON HCL 4 MG/2ML IJ SOLN: 4.0000 mg | Freq: Four times a day (QID) | INTRAMUSCULAR | Status: DC | PRN

## 2022-07-12 MED ORDER — FENTANYL CITRATE (PF) 100 MCG/2ML IJ SOLN
INTRAMUSCULAR | Status: DC | PRN
  Administered 2022-07-12: 25 ug via INTRAVENOUS

## 2022-07-12 MED ORDER — HEPARIN (PORCINE) IN NACL 1000-0.9 UT/500ML-% IV SOLN
INTRAVENOUS | Status: DC | PRN
  Administered 2022-07-12 (×2): 500 mL

## 2022-07-12 MED ORDER — NITROGLYCERIN 0.4 MG SL SUBL
0.4000 mg | SUBLINGUAL_TABLET | SUBLINGUAL | Status: DC | PRN
  Filled 2022-07-12: qty 1

## 2022-07-12 MED ORDER — ASPIRIN 81 MG PO CHEW
324.0000 mg | CHEWABLE_TABLET | Freq: Once | ORAL | Status: AC
  Administered 2022-07-12: 324 mg via ORAL
  Filled 2022-07-12: qty 4

## 2022-07-12 MED ORDER — NITROGLYCERIN 0.4 MG SL SUBL: 0.4000 mg | SUBLINGUAL_TABLET | SUBLINGUAL | Status: DC | PRN

## 2022-07-12 MED ORDER — LIDOCAINE HCL (PF) 1 % IJ SOLN
INTRAMUSCULAR | Status: DC | PRN
  Administered 2022-07-12: 2 mL

## 2022-07-12 MED ORDER — LIDOCAINE VISCOUS HCL 2 % MT SOLN
15.0000 mL | Freq: Once | OROMUCOSAL | Status: AC
  Administered 2022-07-12: 15 mL via ORAL
  Filled 2022-07-12: qty 15

## 2022-07-12 MED ORDER — CARVEDILOL 3.125 MG PO TABS
3.1250 mg | ORAL_TABLET | Freq: Two times a day (BID) | ORAL | Status: DC
  Administered 2022-07-12 – 2022-07-13 (×2): 3.125 mg via ORAL
  Filled 2022-07-12 (×2): qty 1

## 2022-07-12 MED ORDER — MIDAZOLAM HCL 2 MG/2ML IJ SOLN
INTRAMUSCULAR | Status: AC
  Filled 2022-07-12: qty 2

## 2022-07-12 MED ORDER — SODIUM CHLORIDE 0.9 % IV SOLN: INTRAVENOUS | Status: AC

## 2022-07-12 MED ORDER — SODIUM CHLORIDE 0.9 % WEIGHT BASED INFUSION
3.0000 mL/kg/h | INTRAVENOUS | Status: DC
  Administered 2022-07-12: 3 mL/kg/h via INTRAVENOUS

## 2022-07-12 MED ORDER — VERAPAMIL HCL 2.5 MG/ML IV SOLN
INTRAVENOUS | Status: AC
  Filled 2022-07-12: qty 2

## 2022-07-12 MED ORDER — ALUM & MAG HYDROXIDE-SIMETH 200-200-20 MG/5ML PO SUSP
30.0000 mL | ORAL | Status: DC | PRN
  Administered 2022-07-12: 30 mL via ORAL
  Filled 2022-07-12: qty 30

## 2022-07-12 MED ORDER — ONDANSETRON HCL 4 MG/2ML IJ SOLN: 4.0000 mg | Freq: Once | INTRAMUSCULAR | Status: DC

## 2022-07-12 MED ORDER — SODIUM CHLORIDE 0.9 % IV SOLN: 250.0000 mL | INTRAVENOUS | Status: DC | PRN

## 2022-07-12 MED ORDER — VERAPAMIL HCL 2.5 MG/ML IV SOLN
INTRAVENOUS | Status: DC | PRN
  Administered 2022-07-12 (×2): 10 mL via INTRA_ARTERIAL

## 2022-07-12 MED ORDER — MIDAZOLAM HCL 2 MG/2ML IJ SOLN
INTRAMUSCULAR | Status: DC | PRN
  Administered 2022-07-12: 2 mg via INTRAVENOUS

## 2022-07-12 MED ORDER — SODIUM CHLORIDE 0.9% FLUSH: 3.0000 mL | Freq: Two times a day (BID) | INTRAVENOUS | Status: DC

## 2022-07-12 MED ORDER — HEPARIN (PORCINE) IN NACL 1000-0.9 UT/500ML-% IV SOLN
INTRAVENOUS | Status: AC
  Filled 2022-07-12: qty 1000

## 2022-07-12 MED ORDER — LIDOCAINE HCL (PF) 1 % IJ SOLN
INTRAMUSCULAR | Status: AC
  Filled 2022-07-12: qty 30

## 2022-07-12 MED ORDER — FENTANYL CITRATE PF 50 MCG/ML IJ SOSY
50.0000 ug | PREFILLED_SYRINGE | Freq: Once | INTRAMUSCULAR | Status: AC
  Administered 2022-07-12: 50 ug via INTRAVENOUS
  Filled 2022-07-12: qty 1

## 2022-07-12 MED ORDER — DIPHENHYDRAMINE HCL 25 MG PO CAPS
50.0000 mg | ORAL_CAPSULE | Freq: Once | ORAL | Status: AC
  Administered 2022-07-12: 50 mg via ORAL
  Filled 2022-07-12: qty 2

## 2022-07-12 MED ORDER — SODIUM CHLORIDE 0.9 % WEIGHT BASED INFUSION: 3.0000 mL/kg/h | INTRAVENOUS | Status: DC

## 2022-07-12 MED ORDER — DIPHENHYDRAMINE HCL 50 MG/ML IJ SOLN
50.0000 mg | Freq: Once | INTRAMUSCULAR | Status: AC
  Filled 2022-07-12: qty 1

## 2022-07-12 MED ORDER — ASPIRIN 81 MG PO TBEC: 81.0000 mg | DELAYED_RELEASE_TABLET | Freq: Every day | ORAL | Status: DC

## 2022-07-12 MED ORDER — HYDRALAZINE HCL 20 MG/ML IJ SOLN: 10.0000 mg | INTRAMUSCULAR | Status: AC | PRN

## 2022-07-12 MED ORDER — ATORVASTATIN CALCIUM 40 MG PO TABS
40.0000 mg | ORAL_TABLET | Freq: Every evening | ORAL | Status: DC
  Administered 2022-07-12: 40 mg via ORAL
  Filled 2022-07-12: qty 1

## 2022-07-12 MED ORDER — SODIUM CHLORIDE 0.9 % IV SOLN
INTRAVENOUS | Status: AC | PRN
  Administered 2022-07-12: 10 mL/h via INTRAVENOUS

## 2022-07-12 MED ORDER — IOHEXOL 350 MG/ML SOLN
INTRAVENOUS | Status: DC | PRN
  Administered 2022-07-12: 55 mL

## 2022-07-12 SURGICAL SUPPLY — 10 items
BAND ZEPHYR COMPRESS 30 LONG (HEMOSTASIS) IMPLANT
CATH 5FR JL3.5 JR4 ANG PIG MP (CATHETERS) IMPLANT
GLIDESHEATH SLEND SS 6F .021 (SHEATH) IMPLANT
GUIDEWIRE INQWIRE 1.5J.035X260 (WIRE) IMPLANT
INQWIRE 1.5J .035X260CM (WIRE) ×2
KIT HEART LEFT (KITS) ×1 IMPLANT
PACK CARDIAC CATHETERIZATION (CUSTOM PROCEDURE TRAY) ×1 IMPLANT
SHEATH PROBE COVER 6X72 (BAG) IMPLANT
TRANSDUCER W/STOPCOCK (MISCELLANEOUS) ×1 IMPLANT
TUBING CIL FLEX 10 FLL-RA (TUBING) ×1 IMPLANT

## 2022-07-12 NOTE — Progress Notes (Addendum)
ANTICOAGULATION CONSULT NOTE - Follow Up Consult  Pharmacy Consult for heparin Indication: chest pain/ACS  Allergies  Allergen Reactions   Bee Venom Hives and Itching   Sulfa Antibiotics     Other reaction(s): Not available   Sulfamethoxazole-Trimethoprim Other (See Comments)    Unknown reaction   Ivp Dye [Iodinated Contrast Media] Rash    Came home after getting it and was up all night. Hasn't had in 20 to 30 years.    Patient Measurements: Height: 5\' 5"  (165.1 cm) Weight: 64.4 kg (142 lb) IBW/kg (Calculated) : 57  Vital Signs: Temp: 98.2 F (36.8 C) (10/12 0801) Temp Source: Oral (10/12 0801) BP: 111/68 (10/12 0900) Pulse Rate: 91 (10/12 0900)  Labs: Recent Labs    07/12/22 0004 07/12/22 0359 07/12/22 0904  HGB 15.6*  --   --   HCT 45.3  --   --   PLT 213  --   --   HEPARINUNFRC  --   --  0.25*  CREATININE 0.67  --   --   TROPONINIHS 1,378* 4,098*  --      Estimated Creatinine Clearance: 59.7 mL/min (by C-G formula based on SCr of 0.67 mg/dL).   Medical History: History reviewed. No pertinent past medical history.  Assessment: 71yo female c/o CP associated with N/V, initial troponin elevated >> to begin heparin.  Initial heparin level subtherapeutic at 0.25. Plan for cath today per cardiology notes.   Goal of Therapy:  Heparin level 0.3-0.7 units/ml Monitor platelets by anticoagulation protocol: Yes   Plan:  Increase heparin to 850u/hr.  Follow up after cath to determine need for repeat levels.  Monitor heparin levels and CBC.  Esmeralda Arthur, PharmD  07/12/2022,10:03 AM

## 2022-07-12 NOTE — Progress Notes (Signed)
TR BAND REMOVAL  LOCATION:    right radial  DEFLATED PER PROTOCOL:    Yes.    TIME BAND OFF / DRESSING APPLIED:    1620 pm a clean dry dressing applied with gauze and  tegaderm   SITE UPON ARRIVAL:    Level 0  SITE AFTER BAND REMOVAL:    Level 0  CIRCULATION SENSATION AND MOVEMENT:    Within Normal Limits   Yes.    COMMENTS:   Care instruction given to patient

## 2022-07-12 NOTE — Progress Notes (Signed)
ANTICOAGULATION CONSULT NOTE - Initial Consult  Pharmacy Consult for heparin Indication: chest pain/ACS  No Known Allergies  Patient Measurements: Height: 5\' 5"  (165.1 cm) Weight: 64.4 kg (142 lb) IBW/kg (Calculated) : 57  Vital Signs: Temp: 97.7 F (36.5 C) (10/12 0013) Temp Source: Oral (10/12 0013) BP: 157/94 (10/12 0030) Pulse Rate: 81 (10/12 0030)  Labs: Recent Labs    07/12/22 0004  HGB 15.6*  HCT 45.3  PLT 213  CREATININE 0.67  TROPONINIHS 1,378*    Estimated Creatinine Clearance: 59.7 mL/min (by C-G formula based on SCr of 0.67 mg/dL).   Medical History: History reviewed. No pertinent past medical history.  Assessment: 71yo female c/o CP associated with N/V, initial troponin elevated >> to begin heparin.  Goal of Therapy:  Heparin level 0.3-0.7 units/ml Monitor platelets by anticoagulation protocol: Yes   Plan:  Heparin 3000 units IV bolus x1 followed by infusion at 750 units/hr. Monitor heparin levels and CBC.  Wynona Neat, PharmD, BCPS  07/12/2022,1:24 AM

## 2022-07-12 NOTE — Interval H&P Note (Signed)
Cath Lab Visit (complete for each Cath Lab visit)  Clinical Evaluation Leading to the Procedure:   ACS: Yes.    Non-ACS:    Anginal Classification: CCS IV  Anti-ischemic medical therapy: Minimal Therapy (1 class of medications)  Non-Invasive Test Results: No non-invasive testing performed  Prior CABG: No previous CABG      History and Physical Interval Note:  07/12/2022 12:04 PM  Allison Rangel  has presented today for surgery, with the diagnosis of NSTEMI.  The various methods of treatment have been discussed with the patient and family. After consideration of risks, benefits and other options for treatment, the patient has consented to  Procedure(s): LEFT HEART CATH AND CORONARY ANGIOGRAPHY (N/A) as a surgical intervention.  The patient's history has been reviewed, patient examined, no change in status, stable for surgery.  I have reviewed the patient's chart and labs.  Questions were answered to the patient's satisfaction.     Larae Grooms

## 2022-07-12 NOTE — H&P (Addendum)
Cardiology Admission History and Physical   Patient ID: Allison Rangel MRN: [id number]; DOB: Sep 03, 1951   Admission date: 07/11/2022  PCP:  Allison Dills, MD   Allison Rangel Providers Cardiologist:  None        Chief Complaint:  chest pain  Patient Profile:   Allison Rangel is a 71 y.o. female with medical history of esophageal dysfunction (per patient) who is being seen 07/12/2022 for the evaluation of chest pain.  History of Present Illness:   Allison Rangel presented to [facility] via EMS on the evening of 10/11. She reports that around 8:30 last night, she was eating toast and had a piece get caught in her throat prompting a severe coughing fit. While coughing, she had sudden onset of crushing retrosternal chest pain with left arm numbness. She also began to feel nauseous and EMS was called. Upon EMS examination, patient was without vital sign or ECG changes and she elected to stay home. However, shortly after EMS left, chest pain became even more severe (10/10). Patient reportedly was curled in the fetal position due to pain. Patient's husband called EMS back and this time she was taken to [facility] for evaluation. At no point was patient syncopal.  Upon arrival to [facility], patient with ECG shows mild elevation in V6 (not STEMI criteria). GI cocktail was given and patient didn't have significant change to discomfort. Troponin was checked and found elevated to 1378. Patient given ASA and placed on heparin/nitro infusions. Repeat Troponin up to 4098.  Patient denies any history of hypertension, hyperlipidemia, diabetes and is only on OTC PPI and prescription eye drops. She denies any symptoms of chest pain, exertional intolerance, shortness of breath prior to last night. In the ED she is currently chest pain free on heparin/nitro. Endorses a headache since initiation of nitro. Nausea has improved and she feels generally improved compared to last night.   History  reviewed. No pertinent past medical history.  History reviewed. No pertinent surgical history.   Medications Prior to Admission: Prior to Admission medications   Medication Sig Start Date End Date Taking? Authorizing Provider  Ascorbic Acid (VITA-C PO) Take 1 tablet by mouth daily.   Yes [provider]  Latanoprost (XELPROS) 0.005 % EMUL Place 1 drop into both eyes at bedtime.   Yes [provider]  Multiple Vitamins-Calcium (DAILY VITAMINS FOR WOMEN PO) Take 1 tablet by mouth daily.   Yes [provider]  Omega-3 Fatty Acids (OMEGA 3 PO) Take 2 capsules by mouth daily at 12 noon.   Yes [provider]  omeprazole (PRILOSEC) 10 MG capsule Take 10 mg by mouth daily.   Yes [provider]  VITAMIN D, CHOLECALCIFEROL, PO Take 1 capsule by mouth daily.   Yes [provider]     Allergies:    Allergies  Allergen Reactions   Bee Venom Hives and Itching   Sulfa Antibiotics     Other reaction(s): Not available   Sulfamethoxazole-Trimethoprim Other (See Comments)    Unknown reaction   Ivp Dye [Iodinated Contrast Media] Rash    Came home after getting it and was up all night. Hasn't had in 20 to 30 years.    Social History:   Social History   Socioeconomic History   Marital status: Married    Spouse name: Not on file   Number of children: Not on file   Years of education: Not on file   Highest education level: Not on  file  Occupational History   Not on file  Tobacco Use   Smoking status: Not on file   Smokeless tobacco: Not on file  Substance and Sexual Activity   Alcohol use: Not on file   Drug use: Not on file   Sexual activity: Not on file  Other Topics Concern   Not on file  Social History Narrative   Not on file   Social Determinants of Health   Financial Resource Strain: Not on file  Food Insecurity: Not on file  Transportation Needs: Not on file  Physical Activity: Not on file  Stress: Not on file  Social  Connections: Not on file  Intimate Partner Violence: Not on file    Family History:   The patient's family history is not on file.    ROS:  Please see the history of present illness.  All other ROS reviewed and negative.     Physical Exam/Data:   Vitals:   07/12/22 0627 07/12/22 0630 07/12/22 0645 07/12/22 0801  BP: (!) 105/59 102/72 102/84   Pulse: 97 99 92   Resp: (!) 22 12 16    Temp: 98.3 F (36.8 C)   98.2 F (36.8 C)  TempSrc:    Oral  SpO2: 97% 97% 98%   Weight:      Height:        Intake/Output Summary (Last 24 hours) at 07/12/2022 0842 Last data filed at 07/12/2022 0524 Gross per 24 hour  Intake 107.62 ml  Output --  Net 107.62 ml      07/11/2022   11:58 PM 07/01/2018    3:25 PM  Last 3 Weights  Weight (lbs) 142 lb 143 lb 6.4 oz  Weight (kg) 64.411 kg 65.046 kg     Body mass index is 23.63 kg/m.  General:  Well nourished, well developed, in no acute distress HEENT: normal Neck: no JVD Vascular: No carotid bruits; Distal pulses 2+ bilaterally   Cardiac:  normal S1, S2; RRR; no murmur  Lungs:  clear to auscultation bilaterally, no wheezing, rhonchi or rales  Abd: soft, nontender, no hepatomegaly  Ext: no edema Musculoskeletal:  No deformities, BUE and BLE strength normal and equal Skin: warm and dry  Neuro:  CNs 2-12 intact, no focal abnormalities noted Psych:  Normal affect    EKG:  The ECG that was done 07/12/22 @2 :16am was personally reviewed and demonstrates ~1cm elevation in leads II, III, AVF, and V6  Relevant CV Studies: No previous cardiac studies  Laboratory Data:  High Sensitivity Troponin:   Recent Labs  Lab 07/12/22 0004 07/12/22 0359  TROPONINIHS 1,378* 4,098*      Chemistry Recent Labs  Lab 07/12/22 0004  NA 133*  K 3.5  CL 98  CO2 22  GLUCOSE 184*  BUN 18  CREATININE 0.67  CALCIUM 9.5  GFRNONAA >60  ANIONGAP 13    Recent Labs  Lab 07/12/22 0004  PROT 8.1  ALBUMIN 4.7  AST 23  ALT 28  ALKPHOS 73  BILITOT  1.1   Lipids No results for input(s): "CHOL", "TRIG", "HDL", "LABVLDL", "LDLCALC", "CHOLHDL" in the last 168 hours. Hematology Recent Labs  Lab 07/12/22 0004  WBC 5.7  RBC 5.04  HGB 15.6*  HCT 45.3  MCV 89.9  MCH 31.0  MCHC 34.4  RDW 12.9  PLT 213   Thyroid No results for input(s): "TSH", "FREET4" in the last 168 hours. BNPNo results for input(s): "BNP", "PROBNP" in the last 168 hours.  DDimer No results for  input(s): "DDIMER" in the last 168 hours.   Radiology/Studies:  DG Chest 2 View  Result Date: 07/12/2022 CLINICAL DATA:  Chest pain tonight EXAM: CHEST - 2 VIEW COMPARISON:  04/30/2011 FINDINGS: Heart size and pulmonary vascularity are normal. Lungs are clear. No pleural effusions. No pneumothorax. Mediastinal contours appear intact. Degenerative changes in the shoulders. IMPRESSION: No active cardiopulmonary disease. Electronically Signed   By: Burman Nieves M.D.   On: 07/12/2022 00:22     Assessment and Plan:   Chest pain Elevated troponin  Patient with acute onset of substernal chest pain and emesis following a severe coughing episode on the evening of 10/11. Patient otherwise without any chest pain or shortness of breath prior to last night. Most recent ECG with mild elevation in II, III, AVF, V6 (not STEMI criteria). Troponin trended: 1378, 4098.  Somewhat atypical event prompting substernal chest pain. It is possible that this could be Takotsubo given lack of risk factors. Anticipate patient going for LHC today. Currently chest pain free on heparin/nitro. Given that patient is hypotensive with nitro, will defer initiation of beta blocker until nitro is no longer required and BP has stabilized.  Patient reports a rash and itching with a contrast study at least 40 years ago. She denies any angioedema or dyspnea. Will order 4 hour contrast allergy prophylaxis. TTE ordered Will check A1c and TSH with AM labs tomorrow   Shared Decision Making/Informed Consent The  risks [stroke (1 in 1000), death (1 in 1000), kidney failure [usually temporary] (1 in 500), bleeding (1 in 200), allergic reaction [possibly serious] (1 in 200)], benefits (diagnostic support and management of coronary artery disease) and alternatives of a cardiac catheterization were discussed in detail with Ms. Bautch and she is willing to proceed.   Risk Assessment/Risk Scores:    TIMI Risk Score for Unstable Angina or Non-ST Elevation MI:   The patient's TIMI risk score is 4, which indicates a 20% risk of all cause mortality, new or recurrent myocardial infarction or need for urgent revascularization in the next 14 days.       Severity of Illness: The appropriate patient status for this patient is OBSERVATION. Observation status is judged to be reasonable and necessary in order to provide the required intensity of service to ensure the patient's safety. The patient's presenting symptoms, physical exam findings, and initial radiographic and laboratory data in the context of their medical condition is felt to place them at decreased risk for further clinical deterioration. Furthermore, it is anticipated that the patient will be medically stable for discharge from the hospital within 2 midnights of admission.    For questions or updates, please contact [facility] Please consult [email or web address] for contact info under     Signed, Perlie Gold, PA-C  07/12/2022 8:42 AM

## 2022-07-12 NOTE — ED Notes (Signed)
Patient transported to X-ray 

## 2022-07-12 NOTE — ED Provider Notes (Signed)
[facility] EMERGENCY DEPARTMENT Provider Note   CSN: [id number] Arrival date & time: 07/11/22  2350     History  Chief Complaint  Patient presents with   Chest Pain   Emesis    Allison Rangel is a 71 y.o. female.  71 year old female who presents the ER today with chest pain, vomiting and arm pain.  Patient states that while she was eating something earlier tonight she started having retrosternal chest pain shortly followed by some left arm numbness.  She states that they called EMS as she started get nauseous.  They got there and said her vital signs were okay and thus she stayed home.  She states the symptoms got worse and she started throwing up multiple times.  The pain got worse and she was curled up on the floor secondary to the pain.  She thought may be she swallowed something wrong.  She had a bunch of coughing as well.  After couple hours EMS was called back and brought her here for further evaluation.  This time she still have some retrosternal chest pain.  She vomited just prior to my evaluation.  No fevers.  No abdominal pain necessarily.  No medical history.  No other associated symptoms.  Never any problems with following in the past does have a history of indigestion on PPI.   Chest Pain Associated symptoms: vomiting   Emesis      Home Medications Prior to Admission medications   Medication Sig Start Date End Date Taking? Authorizing Provider  omeprazole (PRILOSEC) 10 MG capsule Take 10 mg by mouth daily.   Yes [provider]      Allergies    Bee venom, Sulfa antibiotics, Sulfamethoxazole-trimethoprim, and Ivp dye [iodinated contrast media]    Review of Systems   Review of Systems  Cardiovascular:  Positive for chest pain.  Gastrointestinal:  Positive for vomiting.    Physical Exam Updated Vital Signs BP 102/72   Pulse 99   Temp 98.3 F (36.8 C)   Resp 12   Ht 5\' 5"  (1.651 m)   Wt 64.4 kg   SpO2 97%   BMI 23.63 kg/m  Physical  Exam Vitals and nursing note reviewed.  Constitutional:      General: She is in acute distress (related to pain/nausea).     Appearance: She is well-developed.  HENT:     Head: Normocephalic and atraumatic.  Cardiovascular:     Rate and Rhythm: Normal rate and regular rhythm.  Pulmonary:     Effort: Tachypnea present. No respiratory distress.     Breath sounds: No stridor.  Abdominal:     General: There is no distension.  Musculoskeletal:     Cervical back: Normal range of motion.  Skin:    General: Skin is dry.  Neurological:     Mental Status: She is alert.     ED Results / Procedures / Treatments   Labs (all labs ordered are listed, but only abnormal results are displayed) Labs Reviewed  BASIC METABOLIC PANEL - Abnormal; Notable for the following components:      Result Value   Sodium 133 (*)    Glucose, Bld 184 (*)    All other components within normal limits  CBC - Abnormal; Notable for the following components:   Hemoglobin 15.6 (*)    All other components within normal limits  TROPONIN I (HIGH SENSITIVITY) - Abnormal; Notable for the following components:   Troponin I (High Sensitivity) 1,378 (*)  All other components within normal limits  TROPONIN I (HIGH SENSITIVITY) - Abnormal; Notable for the following components:   Troponin I (High Sensitivity) 4,098 (*)    All other components within normal limits  HEPATIC FUNCTION PANEL  LIPASE, BLOOD  HEPARIN LEVEL (UNFRACTIONATED)    EKG EKG Interpretation  Date/Time:  Thursday July 12 2022 00:01:39 EDT Ventricular Rate:  78 PR Interval:  144 QRS Duration: 95 QT Interval:  418 QTC Calculation: 477 R Axis:   78 Text Interpretation: Sinus rhythm Borderline ST elevation, lateral leads Confirmed by Marily Memos [id number]) on 07/12/2022 12:24:31 AM   EKG Interpretation  Date/Time:  Thursday July 12 2022 01:02:48 EDT Ventricular Rate:  81 PR Interval:  149 QRS Duration: 95 QT Interval:  419 QTC  Calculation: 487 R Axis:   76 Text Interpretation: Sinus rhythm Minimal ST elevation, inferior leads Borderline prolonged QT interval similar to earlier in night Confirmed by Marily Memos [phone]) on 07/12/2022 2:16:37 AM        EKG Interpretation  Date/Time:  Thursday July 12 2022 01:02:48 EDT Ventricular Rate:  81 PR Interval:  149 QRS Duration: 95 QT Interval:  419 QTC Calculation: 487 R Axis:   76 Text Interpretation: Sinus rhythm Minimal ST elevation, inferior leads Borderline prolonged QT interval similar to earlier in night Confirmed by Marily Memos [phone]) on 07/12/2022 2:16:37 AM          Radiology DG Chest 2 View  Result Date: 07/12/2022 CLINICAL DATA:  Chest pain tonight EXAM: CHEST - 2 VIEW COMPARISON:  04/30/2011 FINDINGS: Heart size and pulmonary vascularity are normal. Lungs are clear. No pleural effusions. No pneumothorax. Mediastinal contours appear intact. Degenerative changes in the shoulders. IMPRESSION: No active cardiopulmonary disease. Electronically Signed   By: Burman Nieves M.D.   On: 07/12/2022 00:22    Procedures .Critical Care  Performed by: Marily Memos, MD Authorized by: Marily Memos, MD   Critical care provider statement:    Critical care time (minutes):  30   Critical care was necessary to treat or prevent imminent or life-threatening deterioration of the following conditions:  Cardiac failure   Critical care was time spent personally by me on the following activities:  Development of treatment plan with patient or surrogate, discussions with consultants, evaluation of patient's response to treatment, examination of patient, ordering and review of laboratory studies, ordering and review of radiographic studies, ordering and performing treatments and interventions, pulse oximetry, re-evaluation of patient's condition and review of old charts     Medications Ordered in ED Medications  nitroGLYCERIN 50 mg in dextrose 5 % 250 mL (0.2  mg/mL) infusion (40 mcg/min Intravenous Infusion Verify 07/12/22 0524)  heparin ADULT infusion 100 units/mL (25000 units/222mL) (750 Units/hr Intravenous Infusion Verify 07/12/22 0524)  ondansetron (ZOFRAN) injection 4 mg (0 mg Intravenous Hold 07/12/22 0238)  alum & mag hydroxide-simeth (MAALOX/MYLANTA) 200-200-20 MG/5ML suspension 30 mL (30 mLs Oral Given 07/12/22 0025)    And  lidocaine (XYLOCAINE) 2 % viscous mouth solution 15 mL (15 mLs Oral Given 07/12/22 0025)  fentaNYL (SUBLIMAZE) injection 50 mcg (50 mcg Intravenous Given 07/12/22 0049)  pantoprazole (PROTONIX) injection 40 mg (40 mg Intravenous Given 07/12/22 0049)  aspirin chewable tablet 324 mg (324 mg Oral Given 07/12/22 0103)  heparin bolus via infusion 3,000 Units (3,000 Units Intravenous Bolus from Bag 07/12/22 0202)  ondansetron (ZOFRAN) injection 4 mg (4 mg Intravenous Given 07/12/22 0156)    ED Course/ Medical Decision Making/ A&P  Medical Decision Making Amount and/or Complexity of Data Reviewed Labs: ordered. Radiology: ordered. ECG/medicine tests: ordered.  Risk OTC drugs. Prescription drug management. Decision regarding hospitalization.   Ecg with STE but not meeting criteria for STEMI. Pain meds and maalox/lidocaine provided which did not help pain at all.   First troponin elevated c/w NSTEMI. Repeat ecg without changes. ASA given, heparin started, nitro gtt started to titrate for pain. D/w Dr. Cathie Hoops for admission to [facility], will update with changes.   Reevaluation and patient pain is gone, nausea still present. Zofran ordered.   Reeval. Pain free, nauea improved.   D/w [facility], unlikely to have bed tonight, will initiate ED to ED transfer so cardiology can see and take to cath lab if needed, still pending second troponin.   Second troponin >4000, cards updated. Still symptom free. 40 NTG GTT. Will initiate transfer to [facility] for cardiology evaluation and admission. Dr. Betsey Holiday  accepting.    Final Clinical Impression(s) / ED Diagnoses Final diagnoses:  NSTEMI (non-ST elevated myocardial infarction) [facility])    Rx / [facility] Orders ED Discharge Orders     None         Jerome Otter, Corene Cornea, MD 07/12/22 [phone]

## 2022-07-13 ENCOUNTER — Inpatient Hospital Stay (HOSPITAL_COMMUNITY): Payer: Medicare HMO

## 2022-07-13 ENCOUNTER — Other Ambulatory Visit: Payer: Self-pay

## 2022-07-13 ENCOUNTER — Other Ambulatory Visit (HOSPITAL_COMMUNITY): Payer: Self-pay

## 2022-07-13 ENCOUNTER — Encounter (HOSPITAL_COMMUNITY): Payer: Self-pay | Admitting: Interventional Cardiology

## 2022-07-13 DIAGNOSIS — I214 Non-ST elevation (NSTEMI) myocardial infarction: Secondary | ICD-10-CM | POA: Diagnosis not present

## 2022-07-13 DIAGNOSIS — I5181 Takotsubo syndrome: Secondary | ICD-10-CM

## 2022-07-13 LAB — BASIC METABOLIC PANEL
Anion gap: 9 (ref 5–15)
BUN: 14 mg/dL (ref 8–23)
CO2: 22 mmol/L (ref 22–32)
Calcium: 9.2 mg/dL (ref 8.9–10.3)
Chloride: 103 mmol/L (ref 98–111)
Creatinine, Ser: 0.58 mg/dL (ref 0.44–1.00)
GFR, Estimated: >60 mL/min (ref >60)
Glucose, Bld: 140 mg/dL — ABNORMAL HIGH (ref 70–99)
Potassium: 4.2 mmol/L (ref 3.5–5.1)
Sodium: 134 mmol/L — ABNORMAL LOW (ref 135–145)

## 2022-07-13 LAB — LIPID PANEL
Cholesterol: 213 mg/dL — ABNORMAL HIGH (ref 0–200)
HDL: 81 mg/dL (ref >40)
LDL Cholesterol: 125 mg/dL — ABNORMAL HIGH (ref 0–99)
Total CHOL/HDL Ratio: 2.6 RATIO
Triglycerides: 35 mg/dL (ref <150)
VLDL: 7 mg/dL (ref 0–40)

## 2022-07-13 LAB — TSH: TSH: 0.83 u[IU]/mL (ref 0.350–4.500)

## 2022-07-13 LAB — ECHOCARDIOGRAM COMPLETE
Area-P 1/2: 4.89 cm2
Calc EF: 40.2 %
Height: 65 in
S' Lateral: 2.3 cm
Single Plane A2C EF: 40.9 %
Single Plane A4C EF: 41.8 %
Weight: 2272 oz

## 2022-07-13 LAB — HEMOGLOBIN A1C
Hgb A1c MFr Bld: 5.8 % — ABNORMAL HIGH (ref 4.8–5.6)
Mean Plasma Glucose: 119.76 mg/dL

## 2022-07-13 LAB — HIV ANTIBODY (ROUTINE TESTING W REFLEX): HIV Screen 4th Generation wRfx: NONREACTIVE

## 2022-07-13 MED ORDER — ORAL CARE MOUTH RINSE: 15.0000 mL | OROMUCOSAL | Status: DC | PRN

## 2022-07-13 MED ORDER — ATORVASTATIN CALCIUM 10 MG PO TABS: 10.0000 mg | ORAL_TABLET | Freq: Every evening | ORAL | Status: DC

## 2022-07-13 MED ORDER — ATORVASTATIN CALCIUM 20 MG PO TABS
20.0000 mg | ORAL_TABLET | Freq: Every evening | ORAL | 3 refills | Status: DC
  Filled 2022-07-13: qty 90, 90d supply, fill #0

## 2022-07-13 MED ORDER — ASPIRIN 81 MG PO TBEC
81.0000 mg | DELAYED_RELEASE_TABLET | Freq: Every day | ORAL | 12 refills | Status: DC
  Filled 2022-07-13: qty 30, 30d supply, fill #0

## 2022-07-13 MED ORDER — ATORVASTATIN CALCIUM 10 MG PO TABS: 20.0000 mg | ORAL_TABLET | Freq: Every evening | ORAL | Status: DC

## 2022-07-13 MED ORDER — LOSARTAN POTASSIUM 25 MG PO TABS
12.5000 mg | ORAL_TABLET | Freq: Every day | ORAL | Status: DC
  Administered 2022-07-13: 12.5 mg via ORAL
  Filled 2022-07-13: qty 1

## 2022-07-13 MED ORDER — CARVEDILOL 3.125 MG PO TABS
3.1250 mg | ORAL_TABLET | Freq: Two times a day (BID) | ORAL | 3 refills | Status: DC
  Filled 2022-07-13: qty 180, 90d supply, fill #0

## 2022-07-13 MED ORDER — ASPIRIN 81 MG PO TBEC
81.0000 mg | DELAYED_RELEASE_TABLET | Freq: Every day | ORAL | Status: DC
  Administered 2022-07-13: 81 mg via ORAL
  Filled 2022-07-13: qty 1

## 2022-07-13 NOTE — Progress Notes (Signed)
BP 83/61, HR 82. Patient started on coreg and losartan this morning.  PA  Sandria Senter notified.

## 2022-07-13 NOTE — Progress Notes (Signed)
Mobility Specialist - Progress Note   07/13/22 1218  Mobility  Activity Ambulated with assistance in hallway  Level of Assistance Standby assist, set-up cues, supervision of patient - no hands on  Assistive Device None  Distance Ambulated (ft) 400 ft  Activity Response Tolerated well  Mobility Referral Yes  $Mobility charge 1 Mobility   During mobility: 84 HR Post mobility: 98 HR  Pt received in bed and agreeable to mobility. No complaints throughout.Pt returned to EOB with all needs met.  Larey Seat

## 2022-07-13 NOTE — Progress Notes (Signed)
No significant CAD.  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  She will need carvedilol and losartan for this.  Suspect her ejection fraction will recover.  Plan for discharge today pending ability to tolerate medications and walk around the unit.  Right radial cath site clean and dry.  Telemetry unremarkable.  Discharge today pending. Full DC summary to follow.   Lake Bells T. Audie Box, MD, [place]  [address] [place], [place] [address] [phone]  8:26 AM

## 2022-07-13 NOTE — Plan of Care (Signed)
   I was notified by RN that patient's BP was 83/61. I went to evaluate the patient, she denied having any dizziness or lightheadedness. She was able to walk to the bathroom with assistance from her husband without difficulty. I rechecked BP, it had improved to 95/65. Patient started losartan and carvedilol this AM, we will hold losartan moving forward.   Margie Billet, PA-C 07/13/2022 11:38 AM

## 2022-07-13 NOTE — Progress Notes (Signed)
Echocardiogram 2D Echocardiogram has been performed.  Allison Rangel 07/13/2022, 8:41 AM

## 2022-07-13 NOTE — Progress Notes (Signed)
Pt was ambulated 435ft through hallway with standby assistance. Pt was asymptomatic throughout ambulation and was returned to her room w/o c/p. Pt educated on MI, restrictions, ex guidelines, heart healthy diet, and CRPII. Will refer to [facility].  Christen Bame 07/13/2022 2:11 PM

## 2022-07-13 NOTE — Discharge Summary (Addendum)
Discharge Summary    Patient ID: Allison Rangel MRN: [id number]; DOB: 1951/07/01  Admit date: 07/11/2022 Discharge date: 07/13/2022  PCP:  Renford Dills, MD   [facility] Providers Cardiologist:  Reatha Harps, MD     Discharge Diagnoses    Principal Problem:   NSTEMI (non-ST elevated myocardial infarction) [facility]) Active Problems:   Elevated troponin   Takotsubo cardiomyopathy    Diagnostic Studies/Procedures    Left Heart Catheterization 07/12/22   There is moderate left ventricular systolic dysfunction.   LV end diastolic pressure is normal.   The left ventricular ejection fraction is 35-45% by visual estimate.  LV dysunction with apical hypokinesis in a pattern consistnet with Takotsubo cardiomyopathy.   There is no aortic valve stenosis.   Minimal coronary atherosclerosis.   Medical therapy for stress induced cardiomyopathy.  Diagnostic Dominance: Right   Echocardiogram 07/13/22  1. Wall motion is challenging to assess with windows. hypokinesis mildy  worse in the anteroseptal/inferoseptal/anterior wall from the base to the  apex. Left ventricular ejection fraction, by estimation, is 45 to 50%. The  left ventricle has mildly  decreased function. The left ventricle demonstrates global hypokinesis.  There is mild left ventricular hypertrophy. Left ventricular diastolic  parameters are consistent with Grade I diastolic dysfunction (impaired  relaxation). The average left  ventricular global longitudinal strain is -10.0 %. The global longitudinal  strain is abnormal.   2. Right ventricular systolic function is normal. The right ventricular  size is normal. There is normal pulmonary artery systolic pressure.   3. The mitral valve is degenerative. No evidence of mitral valve  regurgitation.   4. The aortic valve is tricuspid. Aortic valve regurgitation is not  visualized.   5. The inferior vena cava is normal in size with greater than 50%   respiratory variability, suggesting right atrial pressure of 3 mmHg.   Conclusion(s)/Recommendation(s): Findings correlate with LHC, c/f stress  cardiomyopathy.  _____________   History of Present Illness     Allison Rangel is a 71 y.o. female with medical history of esophageal dysfunction (per patient) who was seen 07/12/2022 for the evaluation of chest pain.  Ms. Reinard presented to [facility] ED via EMS on the evening of 10/11. She reported that at around 8:30 PM, she was eating toast and had a piece get caught in her throat prompting a severe coughing fit. While coughing, she had sudden onset of crushing retrosternal chest pain with left arm numbness. She also began to feel nauseous and EMS was called. Upon EMS examination, patient was without vital sign or ECG changes and she elected to stay home. However, shortly after EMS left, chest pain became even more severe (10/10). Patient reportedly was curled in the fetal position due to pain. Patient's husband called EMS back and this time she was taken to [facility] for evaluation. At no point was patient syncopal.   Upon arrival to [facility], ECG showed mild elevation in V6 (not STEMI criteria). GI cocktail was given and patient didn't have significant improvement in symptoms. Troponin was checked and found elevated to 1378. Patient given ASA and placed on heparin/nitro infusions. Repeat Troponin up to 4098. She was transferred to [facility] for further evaluation.    Patient denied any history of hypertension, hyperlipidemia, diabetes and is only on OTC PPI and prescription eye drops. She denied any symptoms of chest pain, exertional intolerance, shortness of breath prior to last night. During our evaluation in the ED, patient was chest  pain free on heparin/nitro. Did complain of a headache since initiation of nitro. Nausea had improved and she felt generally improved compared to last night.  Hospital Course     Consultants:   Chest Pain   Elevated Troponin  Stress-induced Cardiomyopathy  Minimal nonobstructive CAD  - Patient presented to [facility] ED complaining of chest discomfort that occurred after patient chocked on her food while eating dinner. She had a coughing fit, followed by sudden onset of crushing retrosternal chest pain with left arm numbness - hsTn 1378>>4098 - Patient was transported to [facility] for left heart catheterization. Cath showed moderate LV dysfunction with EF 35-45% by visual estimate. LV dysfunction with apical hypokinesis consistent with Takotsubo cardiomyopathy. There was minimal coronary atherosclerosis  - Echocardiogram this admission showed EF 45-50% with regional wall motion abnormalities, mild LVH, grade I diastolic dysfunction, normal RV systolic function. Consistent with stress cardiomyopathy  - Attempted to start patient on Carvedilol and losartan. Received her first doses this AM and BP decreased to 83/61. Prior to discharge, BP had improved to 95/65. Patient was asymptomatic and able to walk in her room/ in the halls without difficulty. We will not discharge the patient on losartan  to prevent hypotension at home.  - Patient will be discharged on carvedilol 3.125 mg BID  - Start daily ASA 81 mg, lipitor 20 mg due to findings of minimal CAD on cath  - Will need LFTs and lipid panel in 2-3 months  - I have arranged an outpatient follow up appointment on 10/20 with APP. Messaged office to arrange an appointment with Dr. Flora Lipps in 2 months  - Right radial cath site was soft, nontender prior to discharge. No bruising or bleeding   Did the patient have an acute coronary syndrome (MI, NSTEMI, STEMI, etc) this admission?:  No                               Did the patient have a percutaneous coronary intervention (stent / angioplasty)?:  No.       Patient was seen and examined by Dr. Flora Lipps and was deemed stable for discharge.   Patient has a follow up appointment on 10/20      _____________  Discharge Vitals Blood pressure 95/65, pulse 81, temperature 97.8 F (36.6 C), temperature source Oral, resp. rate 18, height 5\' 5"  (1.651 m), weight 64.4 kg, SpO2 96 %.  Filed Weights   07/11/22 2358  Weight: 64.4 kg    Labs & Radiologic Studies    CBC Recent Labs    07/12/22 0004  WBC 5.7  HGB 15.6*  HCT 45.3  MCV 89.9  PLT 213   Basic Metabolic Panel Recent Labs    09/11/22 0004 07/13/22 0321  NA 133* 134*  K 3.5 4.2  CL 98 103  CO2 22 22  GLUCOSE 184* 140*  BUN 18 14  CREATININE 0.67 0.58  CALCIUM 9.5 9.2   Liver Function Tests Recent Labs    07/12/22 0004  AST 23  ALT 28  ALKPHOS 73  BILITOT 1.1  PROT 8.1  ALBUMIN 4.7   Recent Labs    07/12/22 0004  LIPASE 36   High Sensitivity Troponin:   Recent Labs  Lab 07/12/22 0004 07/12/22 0359  TROPONINIHS 1,378* 4,098*    BNP Invalid input(s): "POCBNP" D-Dimer No results for input(s): "DDIMER" in the last 72 hours. Hemoglobin A1C Recent Labs    07/13/22 0321  HGBA1C 5.8*   Fasting Lipid Panel Recent Labs    07/13/22 0321  CHOL 213*  HDL 81  LDLCALC 125*  TRIG 35  CHOLHDL 2.6   Thyroid Function Tests Recent Labs    07/13/22 0321  TSH 0.830   _____________  ECHOCARDIOGRAM COMPLETE  Result Date: 07/13/2022    ECHOCARDIOGRAM REPORT   Patient Name:   Allison Rangel Date of Exam: 07/13/2022 Medical Rec #:  [id number]         Height:       65.0 in Accession #:    [id number]        Weight:       142.0 lb Date of Birth:  1951-04-01         BSA:          1.710 m Patient Age:    37 years          BP:           129/88 mmHg Patient Gender: F                 HR:           80 bpm. Exam Location:  Inpatient Procedure: 2D Echo, Cardiac Doppler, Color Doppler and Strain Analysis Indications:    NSTEMI I21.4  History:        Patient has no prior history of Echocardiogram examinations.  Sonographer:    Bernadene Person RDCS Referring Phys: Cassie Freer O'NEAL IMPRESSIONS  1. Wall  motion is challenging to assess with windows. hypokinesis mildy worse in the anteroseptal/inferoseptal/anterior wall from the base to the apex. Left ventricular ejection fraction, by estimation, is 45 to 50%. The left ventricle has mildly decreased function. The left ventricle demonstrates global hypokinesis. There is mild left ventricular hypertrophy. Left ventricular diastolic parameters are consistent with Grade I diastolic dysfunction (impaired relaxation). The average left ventricular global longitudinal strain is -10.0 %. The global longitudinal strain is abnormal.  2. Right ventricular systolic function is normal. The right ventricular size is normal. There is normal pulmonary artery systolic pressure.  3. The mitral valve is degenerative. No evidence of mitral valve regurgitation.  4. The aortic valve is tricuspid. Aortic valve regurgitation is not visualized.  5. The inferior vena cava is normal in size with greater than 50% respiratory variability, suggesting right atrial pressure of 3 mmHg. Conclusion(s)/Recommendation(s): Findings correlate with LHC, c/f stress cardiomyopathy. FINDINGS  Left Ventricle: Wall motion is challenging to assess with windows. hypokinesis mildy worse in the anteroseptal/inferoseptal/anterior wall from the base to the apex. Left ventricular ejection fraction, by estimation, is 45 to 50%. The left ventricle has mildly decreased function. The left ventricle demonstrates global hypokinesis. The average left ventricular global longitudinal strain is -10.0 %. The global longitudinal strain is abnormal. The left ventricular internal cavity size was normal in size. There is mild left ventricular hypertrophy. Left ventricular diastolic parameters are consistent with Grade I diastolic dysfunction (impaired relaxation). Right Ventricle: The right ventricular size is normal. Right ventricular systolic function is normal. There is normal pulmonary artery systolic pressure. The tricuspid  regurgitant velocity is 2.03 m/s, and with an assumed right atrial pressure of 3 mmHg,  the estimated right ventricular systolic pressure is 29.5 mmHg. Left Atrium: Left atrial size was normal in size. Right Atrium: Right atrial size was normal in size. Pericardium: There is no evidence of pericardial effusion. Mitral Valve: The mitral valve is degenerative in appearance. No evidence of mitral valve regurgitation. Tricuspid Valve: The tricuspid  valve is normal in structure. Tricuspid valve regurgitation is trivial. Aortic Valve: The aortic valve is tricuspid. Aortic valve regurgitation is not visualized. Pulmonic Valve: Pulmonic valve regurgitation is not visualized. Aorta: The aortic root is normal in size and structure. Venous: The inferior vena cava is normal in size with greater than 50% respiratory variability, suggesting right atrial pressure of 3 mmHg. IAS/Shunts: No atrial level shunt detected by color flow Doppler.  LEFT VENTRICLE PLAX 2D LVIDd:         3.80 cm     Diastology LVIDs:         2.30 cm     LV e' medial:    4.50 cm/s LV PW:         0.90 cm     LV E/e' medial:  12.2 LV IVS:        1.00 cm     LV e' lateral:   6.68 cm/s LVOT diam:     2.20 cm     LV E/e' lateral: 8.2 LV SV:         54 LV SV Index:   32          2D Longitudinal Strain LVOT Area:     3.80 cm    2D Strain GLS Avg:     -10.0 %  LV Volumes (MOD) LV vol d, MOD A2C: 81.4 ml LV vol d, MOD A4C: 77.1 ml LV vol s, MOD A2C: 48.1 ml LV vol s, MOD A4C: 44.9 ml LV SV MOD A2C:     33.3 ml LV SV MOD A4C:     77.1 ml LV SV MOD BP:      31.9 ml RIGHT VENTRICLE RV S prime:     11.70 cm/s TAPSE (M-mode): 1.9 cm LEFT ATRIUM             Index        RIGHT ATRIUM           Index LA diam:        2.90 cm 1.70 cm/m   RA Area:     10.00 cm LA Vol (A2C):   31.4 ml 18.36 ml/m  RA Volume:   20.10 ml  11.75 ml/m LA Vol (A4C):   29.1 ml 17.02 ml/m LA Biplane Vol: 30.8 ml 18.01 ml/m  AORTIC VALVE LVOT Vmax:   75.00 cm/s LVOT Vmean:  50.700 cm/s LVOT VTI:     0.142 m  AORTA Ao Root diam: 3.90 cm Ao Asc diam:  3.90 cm MITRAL VALVE               TRICUSPID VALVE MV Area (PHT): 4.89 cm    TR Peak grad:   16.5 mmHg MV Decel Time: 155 msec    TR Vmax:        203.00 cm/s MV E velocity: 55.10 cm/s MV A velocity: 65.40 cm/s  SHUNTS MV E/A ratio:  0.84        Systemic VTI:  0.14 m                            Systemic Diam: 2.20 cm Carolan Clines Electronically signed by Carolan Clines Signature Date/Time: 07/13/2022/10:45:49 AM    Final    CARDIAC CATHETERIZATION  Result Date: 07/12/2022   There is moderate left ventricular systolic dysfunction.   LV end diastolic pressure is normal.   The left ventricular ejection fraction is 35-45% by visual estimate.  LV dysunction  with apical hypokinesis in a pattern consistnet with Takotsubo cardiomyopathy.   There is no aortic valve stenosis.   Minimal coronary atherosclerosis. Medical therapy for stress induced cardiomyopathy.   DG Chest 2 View  Result Date: 07/12/2022 CLINICAL DATA:  Chest pain tonight EXAM: CHEST - 2 VIEW COMPARISON:  04/30/2011 FINDINGS: Heart size and pulmonary vascularity are normal. Lungs are clear. No pleural effusions. No pneumothorax. Mediastinal contours appear intact. Degenerative changes in the shoulders. IMPRESSION: No active cardiopulmonary disease. Electronically Signed   By: Burman Nieves M.D.   On: 07/12/2022 00:22   Disposition   Pt is being discharged home today in good condition.  Follow-up Plans & Appointments     Follow-up Information     [name], NP Follow up on 07/20/2022.   Specialties: Nurse Practitioner, Radiology, Cardiology Why: Appointment at 8:25 AM Contact information: [address] [place] [address] [phone]                Discharge Instructions     Diet - low sodium heart healthy   Complete by: As directed    Discharge instructions   Complete by: As directed    Radial Site Care Refer to this sheet in the next few  weeks. These instructions provide you with information on caring for yourself after your procedure. Your caregiver may also give you more specific instructions. Your treatment has been planned according to current medical practices, but problems sometimes occur. Call your caregiver if you have any problems or questions after your procedure. HOME CARE INSTRUCTIONS You may shower the day after the procedure. Remove the bandage (dressing) and gently wash the site with plain soap and water. Gently pat the site dry.  Do not apply powder or lotion to the site.  Do not submerge the affected site in water for 3 to 5 days.  Inspect the site at least twice daily.  Do not flex or bend the affected arm for 24 hours.  No lifting over 5 pounds (2.3 kg) for 5 days after your procedure.  Do not drive home if you are discharged the same day of the procedure. Have someone else drive you.  You may drive 24 hours after the procedure unless otherwise instructed by your caregiver.  What to expect: Any bruising will usually fade within 1 to 2 weeks.  Blood that collects in the tissue (hematoma) may be painful to the touch. It should usually decrease in size and tenderness within 1 to 2 weeks.  SEEK IMMEDIATE MEDICAL CARE IF: You have unusual pain at the radial site.  You have redness, warmth, swelling, or pain at the radial site.  You have drainage (other than a small amount of blood on the dressing).  You have chills.  You have a fever or persistent symptoms for more than 72 hours.  You have a fever and your symptoms suddenly get worse.  Your arm becomes pale, cool, tingly, or numb.  You have heavy bleeding from the site. Hold pressure on the site.   Increase activity slowly   Complete by: As directed         Discharge Medications   Allergies as of 07/13/2022       Reactions   Bee Venom Hives, Itching   Sulfa Antibiotics    Other reaction(s): Not available   Sulfamethoxazole-trimethoprim Other (See  Comments)   Unknown reaction   Ivp Dye [iodinated Contrast Media] Rash   Came home after getting it and was up  all night. Hasn't had in 20 to 30 years.        Medication List     TAKE these medications    aspirin EC 81 MG tablet Take 1 tablet (81 mg total) by mouth daily. Swallow whole. Start taking on: July 14, 2022   atorvastatin 20 MG tablet Commonly known as: LIPITOR Take 1 tablet (20 mg total) by mouth every evening.   carvedilol 3.125 MG tablet Commonly known as: COREG Take 1 tablet (3.125 mg total) by mouth 2 (two) times daily with a meal.   DAILY VITAMINS FOR WOMEN PO Take 1 tablet by mouth daily.   OMEGA 3 PO Take 2 capsules by mouth daily at 12 noon.   omeprazole 10 MG capsule Commonly known as: PRILOSEC Take 10 mg by mouth daily.   VITA-C PO Take 1 tablet by mouth daily.   VITAMIN D (CHOLECALCIFEROL) PO Take 1 capsule by mouth daily.   Xelpros 0.005 % Emul Generic drug: Latanoprost Place 1 drop into both eyes at bedtime.           Outstanding Labs/Studies   LFTs and lipid panel in 2-3 months   Duration of Discharge Encounter   Greater than 30 minutes including physician time.  Signed, Jonita Albee, PA-C 07/13/2022, 12:25 PM

## 2022-07-14 LAB — LIPOPROTEIN A (LPA): Lipoprotein (a): 38.3 nmol/L — ABNORMAL HIGH (ref <75.0)

## 2022-07-16 ENCOUNTER — Telehealth: Payer: Self-pay | Admitting: Cardiovascular Disease

## 2022-07-16 NOTE — Telephone Encounter (Signed)
Patient stated that after d/c from hospital, her SBP went down to the 80s. She stated she became dehydrated while in hospital (only a few ice chips before cath). Her BP this AM was 127/86 and she did not want to take carvedilol 3.125mg . She stated she is hydrating and that her urine is medium yellow. He husband took dog for a walk and will check her BP when he returns. Will call her back in about 30 min for a BP. At 12:20 PM called patient back for BP 129/75, P 89 and 125/67, P 77. Patient does not want to take any more carvedilol. Recommended that she check BP this evening to make sure her BP is not elevated. Pt has appointment with Arnold Long on 10/20.  Please advise on carvedilol.

## 2022-07-16 NOTE — Telephone Encounter (Signed)
Per Dr. Audie Box: patient advised to hold carvedilol until appointment on Friday. She verbalized understanding.

## 2022-07-16 NOTE — Telephone Encounter (Signed)
Pt c/o medication issue:  1. Name of Medication: carvedilol (COREG) 3.125 MG tablet  2. How are you currently taking this medication (dosage and times per day)? Not currently taking   3. Are you having a reaction (difficulty breathing--STAT)? Yes  4. What is your medication issue? Patient is calling stating she stopped taking this medication as of Sunday 10/15 due to it causing her BP to drop to the 80's/50's and dizziness. She states the hospital advised she may not be able to tolerate this medication. She is wanting recommendations until she is seen on Friday. Please advise.

## 2022-07-17 ENCOUNTER — Telehealth (HOSPITAL_COMMUNITY): Payer: Self-pay

## 2022-07-17 ENCOUNTER — Telehealth: Payer: Self-pay | Admitting: Cardiovascular Disease

## 2022-07-17 NOTE — Telephone Encounter (Signed)
Pt returned phone call and stated that her copay is too high for cardiac rehab and she is declining the program for now. Closed referral.

## 2022-07-19 NOTE — Progress Notes (Signed)
Cardiology Clinic Note   Patient Name: Allison Rangel Date of Encounter: 07/20/2022  Primary Care Provider:  Pcp, No Primary Cardiologist:  Evalina Field, MD  Patient Profile    71 year old female recent admission for NSTEMI on 07/11/2022. Had LHC revealing Takotsubo CM, minimal coronary artery atherosclerosis. Echo revealed EF of 45%-50%. The chest pain occurred after choking on a piece of toast. TC 213, LDL 125.LPa 38.3.  On discharge she was prescribed ASA 81 mg, Lipitor 20 mg daily, and carvedilol 3.125 mg twice a day, with need to repeat labs in January 2024. Since discharge, she called our office on 07/16/2022 with complaints of hypotension. She experienced hypotension during hospitalization on both losartan and coreg. Losartan was discontinued prior to discharge. She was advised to stop taking the carvedilol until follow up appointment.   Past Medical History    No past medical history on file. Past Surgical History:  Procedure Laterality Date   LEFT HEART CATH AND CORONARY ANGIOGRAPHY N/A 07/12/2022   Procedure: LEFT HEART CATH AND CORONARY ANGIOGRAPHY;  Surgeon: Jettie Booze, MD;  Location: [facility];  Service: Cardiovascular;  Laterality: N/A;    Allergies  Allergies  Allergen Reactions   Bee Venom Hives and Itching   Sulfa Antibiotics     Other reaction(s): Not available   Sulfamethoxazole-Trimethoprim Other (See Comments)    Unknown reaction   Ivp Dye [Iodinated Contrast Media] Rash    Came home after getting it and was up all night. Hasn't had in 20 to 30 years.    History of Present Illness    Allison Rangel is a pleasant 71 year old patient who presents today for hospital follow up after admission for NSTEMI, dx Takotsubo CM, minimal CAD, and hyperlipidemia. Was unable to tolerate coreg 3.125 mg BID due to hypotension and was asked to stop medication prior to this follow up appointment.  Since stopping carvedilol, blood pressure has improved  significantly.  Blood pressure at home has been running 88/55, 80/50, heart rates 70s to 90s.  She has had improvement to 010, 932, systolic over 35T.  No further symptoms.  She denies any recurrent discomfort in her chest, palpitations, or shortness of breath.  She would like to return to yoga and walking.  Home Medications    Current Outpatient Medications  Medication Sig Dispense Refill   Ascorbic Acid (VITA-C PO) Take 1 tablet by mouth daily.     aspirin EC 81 MG tablet Take 1 tablet (81 mg total) by mouth daily. Swallow whole. 30 tablet 12   atorvastatin (LIPITOR) 20 MG tablet Take 1 tablet (20 mg total) by mouth every evening. 90 tablet 3   Latanoprost (XELPROS) 0.005 % EMUL Place 1 drop into both eyes at bedtime.     Multiple Vitamins-Calcium (DAILY VITAMINS FOR WOMEN PO) Take 1 tablet by mouth daily.     Omega-3 Fatty Acids (OMEGA 3 PO) Take 2 capsules by mouth daily at 12 noon.     omeprazole (PRILOSEC) 10 MG capsule Take 10 mg by mouth daily.     VITAMIN D, CHOLECALCIFEROL, PO Take 1 capsule by mouth daily.     carvedilol (COREG) 3.125 MG tablet Take 1 tablet (3.125 mg total) by mouth 2 (two) times daily with a meal. (Patient not taking: Reported on 07/20/2022) 180 tablet 3   No current facility-administered medications for this visit.     Family History    No family history on file. has no family status information on  file.   Social History    Social History   Socioeconomic History   Marital status: Married    Spouse name: Not on file   Number of children: Not on file   Years of education: Not on file   Highest education level: Not on file  Occupational History   Not on file  Tobacco Use   Smoking status: Never   Smokeless tobacco: Never  Substance and Sexual Activity   Alcohol use: Not on file   Drug use: Not on file   Sexual activity: Not on file  Other Topics Concern   Not on file  Social History Narrative   Not on file   Social Determinants of Health    Financial Resource Strain: Not on file  Food Insecurity: No Food Insecurity (07/13/2022)   Hunger Vital Sign    Worried About Running Out of Food in the Last Year: Never true    Ran Out of Food in the Last Year: Never true  Transportation Needs: No Transportation Needs (07/13/2022)   PRAPARE - Administrator, Civil Service (Medical): No    Lack of Transportation (Non-Medical): No  Physical Activity: Not on file  Stress: Not on file  Social Connections: Not on file  Intimate Partner Violence: Not At Risk (07/13/2022)   Humiliation, Afraid, Rape, and Kick questionnaire    Fear of Current or Ex-Partner: No    Emotionally Abused: No    Physically Abused: No    Sexually Abused: No     Review of Systems    General:  No chills, fever, night sweats or weight changes.  Cardiovascular:  No chest pain, dyspnea on exertion, edema, orthopnea, palpitations, paroxysmal nocturnal dyspnea. Dermatological: No rash, lesions/masses Respiratory: No cough, dyspnea Urologic: No hematuria, dysuria Abdominal:   No nausea, vomiting, diarrhea, bright red blood per rectum, melena, or hematemesis Neurologic:  No visual changes, wkns, changes in mental status. All other systems reviewed and are otherwise negative except as noted above.     Physical Exam    VS:  BP 122/68 (BP Location: Left Arm, Patient Position: Sitting, Cuff Size: Normal)   Pulse 84   Ht 5\' 4"  (1.626 m)   Wt 143 lb 6.4 oz (65 kg)   SpO2 97%   BMI 24.61 kg/m  , BMI Body mass index is 24.61 kg/m.     GEN: Well nourished, well developed, in no acute distress. HEENT: normal. Neck: Supple, no JVD, carotid bruits, or masses. Cardiac: RRR, no murmurs, rubs, or gallops. No clubbing, cyanosis, edema.  Radials/DP/PT 2+ and equal bilaterally.  Respiratory:  Respirations regular and unlabored, clear to auscultation bilaterally. GI: Soft, nontender, nondistended, BS + x 4. MS: no deformity or atrophy.  Ecchymosis noted distal  to catheterization insertion site.  No hematoma. Skin: warm and dry, no rash. Neuro:  Strength and sensation are intact. Psych: Normal affect.  Accessory Clinical Findings    ECG personally reviewed by me today-normal sinus rhythm, low voltage,-\T wave inversion inferior, anterior, and lateral leads.  Heart rate of 84 bpm.  New from prior EKG on July 12, 2022.  Lab Results  Component Value Date   WBC 5.7 07/12/2022   HGB 15.6 (H) 07/12/2022   HCT 45.3 07/12/2022   MCV 89.9 07/12/2022   PLT 213 07/12/2022   Lab Results  Component Value Date   CREATININE 0.58 07/13/2022   BUN 14 07/13/2022   NA 134 (L) 07/13/2022   K 4.2 07/13/2022   CL  103 07/13/2022   CO2 22 07/13/2022   Lab Results  Component Value Date   ALT 28 07/12/2022   AST 23 07/12/2022   ALKPHOS 73 07/12/2022   BILITOT 1.1 07/12/2022   Lab Results  Component Value Date   CHOL 213 (H) 07/13/2022   HDL 81 07/13/2022   LDLCALC 125 (H) 07/13/2022   TRIG 35 07/13/2022   CHOLHDL 2.6 07/13/2022    Lab Results  Component Value Date   HGBA1C 5.8 (H) 07/13/2022    Review of Prior Studies: Left Heart Catheterization 07/12/22   There is moderate left ventricular systolic dysfunction.   LV end diastolic pressure is normal.   The left ventricular ejection fraction is 35-45% by visual estimate.  LV dysunction with apical hypokinesis in a pattern consistnet with Takotsubo cardiomyopathy.   There is no aortic valve stenosis.   Minimal coronary atherosclerosis.   Medical therapy for stress induced cardiomyopathy.  Diagnostic Dominance: Right    Echocardiogram 07/13/22  1. Wall motion is challenging to assess with windows. hypokinesis mildy  worse in the anteroseptal/inferoseptal/anterior wall from the base to the  apex. Left ventricular ejection fraction, by estimation, is 45 to 50%. The  left ventricle has mildly  decreased function. The left ventricle demonstrates global hypokinesis.  There is mild left  ventricular hypertrophy. Left ventricular diastolic  parameters are consistent with Grade I diastolic dysfunction (impaired  relaxation). The average left  ventricular global longitudinal strain is -10.0 %. The global longitudinal  strain is abnormal.   2. Right ventricular systolic function is normal. The right ventricular  size is normal. There is normal pulmonary artery systolic pressure.   3. The mitral valve is degenerative. No evidence of mitral valve  regurgitation.   4. The aortic valve is tricuspid. Aortic valve regurgitation is not  visualized.   5. The inferior vena cava is normal in size with greater than 50%  respiratory variability, suggesting right atrial pressure of 3 mmHg.    Assessment & Plan   1.  Takotsubo's cardiomyopathy: Noted during recent hospitalization for NSTEMI, LHC and echocardiogram confirmed.  LHC revealed minimal CAD.  She was unable to tolerate beta-blocker and ARB, as result of hypotensive response.  Will not restart beta-blocker or ARB at this time as her blood pressure is low normal.  I have given patient education concerning Takotsubo cardiomyopathy.  If she has recurrent symptoms we may need to consider starting her on a low-dose beta-blocker, metoprolol 12.5 mg daily.  I will repeat her echocardiogram now that she has recovered for comparison to prior echo during hospitalization.  I have advised her to go back to her normal activities to include walking and yoga.  Concerning poses that we require her to have weightbearing on her wrists I have asked her to wait an additional week before doing so.  2.  Hyperlipidemia: Currently on Lipitor 20 mg daily.  We will continue to follow levels.  Goal of LDL less than 100, goal of total cholesterol less than 200.    Current medicines are reviewed at length with the patient today.  I have spent 25 min's  dedicated to the care of this patient on the date of this encounter to include pre-visit review of records,  assessment, management and diagnostic testing,with shared decision making. Signed, Bettey Mare. Liborio Nixon, ANP, AACC   07/20/2022 10:10 AM      Office [phone] Fax [phone]  Notice: This dictation was prepared with Dragon dictation along with smaller phrase technology.  Any transcriptional errors that result from this process are unintentional and may not be corrected upon review.

## 2022-07-20 ENCOUNTER — Ambulatory Visit (INDEPENDENT_AMBULATORY_CARE_PROVIDER_SITE_OTHER): Payer: Medicare HMO | Admitting: Adult Health

## 2022-07-20 ENCOUNTER — Encounter: Payer: Self-pay | Admitting: Adult Health

## 2022-07-20 VITALS — BP 122/68 | HR 84 | Ht 64.0 in | Wt 143.4 lb

## 2022-07-20 DIAGNOSIS — I428 Other cardiomyopathies: Secondary | ICD-10-CM | POA: Diagnosis not present

## 2022-07-20 NOTE — Patient Instructions (Signed)
Medication Instructions:  No Changes *If you need a refill on your cardiac medications before your next appointment, please call your pharmacy*   Lab Work: No labs If you have labs (blood work) drawn today and your tests are completely normal, you will receive your results only by: [facility] (if you have MyChart) OR A paper copy in the mail If you have any lab test that is abnormal or we need to change your treatment, we will call you to review the results.   Testing/Procedures: [address], [address]. Your physician has requested that you have an echocardiogram. Echocardiography is a painless test that uses sound waves to create images of your heart. It provides your doctor with information about the size and shape of your heart and how well your heart's chambers and valves are working. This procedure takes approximately one hour. There are no restrictions for this procedure. Please do NOT wear cologne, perfume, aftershave, or lotions (deodorant is allowed). Please arrive 15 minutes prior to your appointment time.    Follow-Up: At [facility], you and your health needs are our priority.  As part of our continuing mission to provide you with exceptional heart care, we have created designated Provider Care Teams.  These Care Teams include your primary Cardiologist (physician) and Advanced Practice Providers (APPs -  Physician Assistants and Nurse Practitioners) who all work together to provide you with the care you need, when you need it.  We recommend signing up for the patient portal called "MyChart".  Sign up information is provided on this After Visit Summary.  MyChart is used to connect with patients for Virtual Visits (Telemedicine).  Patients are able to view lab/test results, encounter notes, upcoming appointments, etc.  Non-urgent messages can be sent to your provider as well.   To learn more about what you can do with MyChart, go to  [email or web address].    Your next appointment:   1 month(s)  The format for your next appointment:   In Person  Provider:   Jory Sims, DNP, ANP  or, Evalina Field, MD

## 2022-08-06 ENCOUNTER — Ambulatory Visit (HOSPITAL_COMMUNITY): Payer: Medicare HMO | Attending: Adult Health

## 2022-08-06 DIAGNOSIS — I428 Other cardiomyopathies: Secondary | ICD-10-CM | POA: Diagnosis not present

## 2022-08-06 LAB — ECHOCARDIOGRAM COMPLETE
Area-P 1/2: 3.21 cm2
S' Lateral: 2.3 cm

## 2022-08-08 ENCOUNTER — Telehealth: Payer: Self-pay

## 2022-08-08 NOTE — Telephone Encounter (Addendum)
Patient returned call regarding results. Patient had understanding of results.----- Message from [name], NP sent at 08/06/2022  4:04 PM EST ----- Marked improvement in the heart pumping function with no evidence of Takotsubo abnormality as seen on her cardiac cath.  She does not need to add any new medications unless she is having symptoms again. Good report.   Allison Rangel

## 2022-08-08 NOTE — Telephone Encounter (Addendum)
Called patient regarding results . Left message for patient to call office.----- Message from [name], NP sent at 08/06/2022  4:04 PM EST ----- Marked improvement in the heart pumping function with no evidence of Takotsubo abnormality as seen on her cardiac cath.  She does not need to add any new medications unless she is having symptoms again. Good report.   Allison Rangel

## 2022-08-20 ENCOUNTER — Other Ambulatory Visit (HOSPITAL_COMMUNITY): Payer: Self-pay

## 2022-08-30 DIAGNOSIS — E559 Vitamin D deficiency, unspecified: Secondary | ICD-10-CM | POA: Diagnosis not present

## 2022-09-10 NOTE — Progress Notes (Unsigned)
Cardiology Office Note:   Date:  09/11/2022  NAME:  Allison Rangel    MRN: [id number] DOB:  1951-06-29   PCP:  Shawnie Dapper, PA-C  Cardiologist:  Reatha Harps, MD  Electrophysiologist:  None   Referring MD: No ref. provider found   Chief Complaint  Patient presents with   Follow-up   History of Present Illness:   Allison Rangel is a 71 y.o. female with a hx of takotsubo cardiomyopathy who presents for follow-up.  No symptoms of heart failure.  Doing well.  Ejection fraction has completely recovered.  I reviewed her echo.  No regional wall motion abnormalities on my review.  She is on aspirin and statin for preventive purposes.  Denies chest pain.  Walking 2 miles per day.  Blood pressure well-controlled.  No issues.  Problem List Takotsubo Cardiomyopathy  -07/2022 2.  Nonobstructive CAD  Past Medical History: History reviewed. No pertinent past medical history.  Past Surgical History: Past Surgical History:  Procedure Laterality Date   LEFT HEART CATH AND CORONARY ANGIOGRAPHY N/A 07/12/2022   Procedure: LEFT HEART CATH AND CORONARY ANGIOGRAPHY;  Surgeon: Corky Crafts, MD;  Location: [facility];  Service: Cardiovascular;  Laterality: N/A;    Current Medications: Current Meds  Medication Sig   Ascorbic Acid (VITA-C PO) Take 1 tablet by mouth daily.   aspirin EC 81 MG tablet Take 1 tablet (81 mg total) by mouth daily. Swallow whole.   atorvastatin (LIPITOR) 20 MG tablet Take 1 tablet (20 mg total) by mouth every evening.   Latanoprost (XELPROS) 0.005 % EMUL Place 1 drop into both eyes at bedtime.   Multiple Vitamins-Calcium (DAILY VITAMINS FOR WOMEN PO) Take 1 tablet by mouth daily.   Omega-3 Fatty Acids (OMEGA 3 PO) Take 2 capsules by mouth daily at 12 noon.   omeprazole (PRILOSEC) 10 MG capsule Take 10 mg by mouth daily.   VITAMIN D, CHOLECALCIFEROL, PO Take 1 capsule by mouth daily.     Allergies:    Bee venom, Sulfa antibiotics,  Sulfamethoxazole-trimethoprim, and Ivp dye [iodinated contrast media]   Social History: Social History   Socioeconomic History   Marital status: Married    Spouse name: Not on file   Number of children: Not on file   Years of education: Not on file   Highest education level: Not on file  Occupational History   Not on file  Tobacco Use   Smoking status: Never   Smokeless tobacco: Never  Substance and Sexual Activity   Alcohol use: Not on file   Drug use: Not on file   Sexual activity: Not on file  Other Topics Concern   Not on file  Social History Narrative   Not on file   Social Determinants of Health   Financial Resource Strain: Not on file  Food Insecurity: No Food Insecurity (07/13/2022)   Hunger Vital Sign    Worried About Running Out of Food in the Last Year: Never true    Ran Out of Food in the Last Year: Never true  Transportation Needs: No Transportation Needs (07/13/2022)   PRAPARE - Administrator, Civil Service (Medical): No    Lack of Transportation (Non-Medical): No  Physical Activity: Not on file  Stress: Not on file  Social Connections: Not on file     Family History: The patient's family history is not on file.  ROS:   All other ROS reviewed and negative. Pertinent positives noted in  the HPI.     EKGs/Labs/Other Studies Reviewed:   The following studies were personally reviewed by me today:  TTE 08/06/2022  1. Left ventricular ejection fraction, by estimation, is 50 to 55%. Left  ventricular ejection fraction by 3D volume is 50 %. The left ventricle has  low normal function. The left ventricle demonstrates regional wall motion  abnormalities (see scoring  diagram/findings for description). Left ventricular diastolic parameters  are consistent with Grade I diastolic dysfunction (impaired relaxation).  There is mild hypokinesis of the left ventricular, apical apical segment.  The average left ventricular  global longitudinal strain is  -16.7 %. The global longitudinal strain is  abnormal.   2. Right ventricular systolic function is normal. The right ventricular  size is normal.   3. The mitral valve is abnormal. Trivial mitral valve regurgitation.   4. The aortic valve is tricuspid. Aortic valve regurgitation is not  visualized.   Comparison(s): Changes from prior study are noted. 07/13/2022: LVEF  45-50%, anteroseptal, septal hypokinesis.   Recent Labs: 07/12/2022: ALT 28; Hemoglobin 15.6; Platelets 213 07/13/2022: BUN 14; Creatinine, Ser 0.58; Potassium 4.2; Sodium 134; TSH 0.830   Recent Lipid Panel    Component Value Date/Time   CHOL 213 (H) 07/13/2022 0321   TRIG 35 07/13/2022 0321   HDL 81 07/13/2022 0321   CHOLHDL 2.6 07/13/2022 0321   VLDL 7 07/13/2022 0321   LDLCALC 125 (H) 07/13/2022 0321    Physical Exam:   VS:  BP 116/72   Pulse 74   Ht 5\' 4"  (1.626 m)   Wt 149 lb 9.6 oz (67.9 kg)   SpO2 96%   BMI 25.68 kg/m    Wt Readings from Last 3 Encounters:  09/11/22 149 lb 9.6 oz (67.9 kg)  07/20/22 143 lb 6.4 oz (65 kg)  07/11/22 142 lb (64.4 kg)    General: Well nourished, well developed, in no acute distress Head: Atraumatic, normal size  Eyes: PEERLA, EOMI  Neck: Supple, no JVD Endocrine: No thryomegaly Cardiac: Normal S1, S2; RRR; no murmurs, rubs, or gallops Lungs: Clear to auscultation bilaterally, no wheezing, rhonchi or rales  Abd: Soft, nontender, no hepatomegaly  Ext: No edema, pulses 2+ Musculoskeletal: No deformities, BUE and BLE strength normal and equal Skin: Warm and dry, no rashes   Neuro: Alert and oriented to person, place, time, and situation, CNII-XII grossly intact, no focal deficits  Psych: Normal mood and affect   ASSESSMENT:   Allison Rangel is a 71 y.o. female who presents for the following: 1. Nonischemic cardiomyopathy (HCC)     PLAN:   1. Nonischemic cardiomyopathy (HCC) -Admitted with Takotsubo cardiomyopathy in October.  Ejection fraction has completely  recovered.  Cannot tolerate any GDMT due to low blood pressure.  Not necessary.  She has nonobstructive CAD.  Should continue with aspirin and statin.  She will see November yearly.  Disposition: Return in about 1 year (around 09/12/2023).  Medication Adjustments/Labs and Tests Ordered: Current medicines are reviewed at length with the patient today.  Concerns regarding medicines are outlined above.  No orders of the defined types were placed in this encounter.  No orders of the defined types were placed in this encounter.   Patient Instructions  Medication Instructions:  The current medical regimen is effective;  continue present plan and medications.  *If you need a refill on your cardiac medications before your next appointment, please call your pharmacy*   Follow-Up: At [facility], you and your health needs  are our priority.  As part of our continuing mission to provide you with exceptional heart care, we have created designated Provider Care Teams.  These Care Teams include your primary Cardiologist (physician) and Advanced Practice Providers (APPs -  Physician Assistants and Nurse Practitioners) who all work together to provide you with the care you need, when you need it.  We recommend signing up for the patient portal called "MyChart".  Sign up information is provided on this After Visit Summary.  MyChart is used to connect with patients for Virtual Visits (Telemedicine).  Patients are able to view lab/test results, encounter notes, upcoming appointments, etc.  Non-urgent messages can be sent to your provider as well.   To learn more about what you can do with MyChart, go to [email or web address].    Your next appointment:   12 month(s)  The format for your next appointment:   In Person  Provider:   Reatha Harps, MD or Marjie Skiff, PA-C or [place], [place].          Time Spent with Patient: I have spent a total of 25 minutes with patient reviewing hospital  notes, telemetry, EKGs, labs and examining the patient as well as establishing an assessment and plan that was discussed with the patient.  > 50% of time was spent in direct patient care.  Signed, Lenna Gilford. Flora Lipps, MD, [facility]  [facility]  [address] [place], [place] [address] [phone]  09/11/2022 9:30 AM

## 2022-09-11 ENCOUNTER — Ambulatory Visit: Payer: Medicare HMO | Attending: Cardiovascular Disease | Admitting: Cardiovascular Disease

## 2022-09-11 ENCOUNTER — Encounter: Payer: Self-pay | Admitting: Cardiovascular Disease

## 2022-09-11 VITALS — BP 116/72 | HR 74 | Ht 64.0 in | Wt 149.6 lb

## 2022-09-11 DIAGNOSIS — I428 Other cardiomyopathies: Secondary | ICD-10-CM

## 2022-09-11 NOTE — Patient Instructions (Signed)
Medication Instructions:  The current medical regimen is effective;  continue present plan and medications.  *If you need a refill on your cardiac medications before your next appointment, please call your pharmacy*   Follow-Up: At [facility], you and your health needs are our priority.  As part of our continuing mission to provide you with exceptional heart care, we have created designated Provider Care Teams.  These Care Teams include your primary Cardiologist (physician) and Advanced Practice Providers (APPs -  Physician Assistants and Nurse Practitioners) who all work together to provide you with the care you need, when you need it.  We recommend signing up for the patient portal called "MyChart".  Sign up information is provided on this After Visit Summary.  MyChart is used to connect with patients for Virtual Visits (Telemedicine).  Patients are able to view lab/test results, encounter notes, upcoming appointments, etc.  Non-urgent messages can be sent to your provider as well.   To learn more about what you can do with MyChart, go to [email or web address].    Your next appointment:   12 month(s)  The format for your next appointment:   In Person  Provider:   Reatha Harps, MD or Marjie Skiff, PA-C or [place], [place].

## 2022-09-18 DIAGNOSIS — H5213 Myopia, bilateral: Secondary | ICD-10-CM | POA: Diagnosis not present

## 2022-09-25 DIAGNOSIS — Z1231 Encounter for screening mammogram for malignant neoplasm of breast: Secondary | ICD-10-CM | POA: Diagnosis not present

## 2022-10-08 ENCOUNTER — Telehealth: Payer: Self-pay | Admitting: Cardiovascular Disease

## 2022-10-08 MED ORDER — ATORVASTATIN CALCIUM 20 MG PO TABS: 20.0000 mg | ORAL_TABLET | Freq: Every evening | ORAL | 3 refills | Status: DC

## 2022-10-08 NOTE — Telephone Encounter (Signed)
*  STAT* If patient is at the pharmacy, call can be transferred to refill team.   1. Which medications need to be refilled? (please list name of each medication and dose if known)  atorvastatin (LIPITOR) 20 MG tablet  2. Which pharmacy/location (including street and city if local pharmacy) is medication to be sent to? [facility] [address] [place], [place] [address]  3. Do they need a 30 day or 90 day supply?  90 day supply

## 2022-10-09 ENCOUNTER — Telehealth: Payer: Self-pay | Admitting: Cardiovascular Disease

## 2022-10-09 MED ORDER — ATORVASTATIN CALCIUM 20 MG PO TABS: 20.0000 mg | ORAL_TABLET | Freq: Every evening | ORAL | 3 refills | Status: DC

## 2022-10-09 NOTE — Telephone Encounter (Signed)
  Pt c/o medication issue:  1. Name of Medication: atorvastatin (LIPITOR) 20 MG tablet   2. How are you currently taking this medication (dosage and times per day)? Take 1 tablet (20 mg total) by mouth every evening  3. Are you having a reaction (difficulty breathing--STAT)?no   4. What is your medication issue? Pt is calling back, she said, she requested for her refill sent to  [facility] [address], [place],  [address]  but it was still sent to [facility]. She said, if refill can be resend to correct pharmacy

## 2022-10-09 NOTE — Telephone Encounter (Signed)
Rx sent to pharmacy:   Disp Refills Start End   atorvastatin (LIPITOR) 20 MG tablet 90 tablet 3 10/09/2022    Sig - Route: Take 1 tablet (20 mg total) by mouth every evening. - Oral   Sent to pharmacy as: atorvastatin (LIPITOR) 20 MG tablet   E-Prescribing Status: Receipt confirmed by pharmacy (10/09/2022  3:04 PM EST)    Pharmacy  [place], [place] - [id number]    Attempt to call patient to make aware-no answer.

## 2022-10-31 ENCOUNTER — Ambulatory Visit: Payer: No Typology Code available for payment source | Admitting: Cardiovascular Disease

## 2023-01-17 DIAGNOSIS — K219 Gastro-esophageal reflux disease without esophagitis: Secondary | ICD-10-CM | POA: Insufficient documentation

## 2023-01-17 DIAGNOSIS — H40053 Ocular hypertension, bilateral: Secondary | ICD-10-CM | POA: Insufficient documentation

## 2023-01-17 DIAGNOSIS — E785 Hyperlipidemia, unspecified: Secondary | ICD-10-CM | POA: Insufficient documentation

## 2023-01-18 LAB — COMPREHENSIVE METABOLIC PANEL (CC13): EGFR: 98

## 2023-02-08 ENCOUNTER — Encounter: Payer: Self-pay | Admitting: Gastroenterology

## 2023-02-26 ENCOUNTER — Ambulatory Visit (AMBULATORY_SURGERY_CENTER): Payer: Medicare HMO | Admitting: *Deleted

## 2023-02-26 ENCOUNTER — Telehealth: Payer: Self-pay | Admitting: *Deleted

## 2023-02-26 ENCOUNTER — Encounter: Payer: Self-pay | Admitting: Gastroenterology

## 2023-02-26 VITALS — Ht 64.0 in | Wt 144.0 lb

## 2023-02-26 DIAGNOSIS — Z86010 Personal history of colonic polyps: Secondary | ICD-10-CM

## 2023-02-26 DIAGNOSIS — Z1211 Encounter for screening for malignant neoplasm of colon: Secondary | ICD-10-CM

## 2023-02-26 MED ORDER — NA SULFATE-K SULFATE-MG SULF 17.5-3.13-1.6 GM/177ML PO SOLN: 1.0000 | Freq: Once | ORAL | 0 refills | Status: AC

## 2023-02-26 NOTE — Progress Notes (Signed)
Pt's name and DOB verified at the beginning of the pre-visit.  Pt denies any difficulty with ambulating,sitting, laying down or rolling side to side Gave both [facility] main # and MD on call # prior to instructions.  No egg or soy allergy known to patient  No issues known to pt with past sedation with any surgeries or procedures Pt denies having issues being intubated Pt has no issues moving head neck or swallowing No FH of Malignant Hyperthermia Pt is not on diet pills Pt is not on home 02  Pt is not on blood thinners  Pt denies issues with constipation  Pt is not on dialysis Pt denise any abnormal heart rhythms  Pt denies any upcoming cardiac testing Pt encouraged to use to use Singlecare or Goodrx to reduce cost  Patient's chart reviewed by Cathlyn Parsons CNRA prior to pre-visit and patient appropriate for the [facility].  Pre-visit completed and red dot placed by patient's name on their procedure day (on provider's schedule).  . Visit by phone Pt states weight is 144 lb Instructed pt why it is important to and  to call if they have any changes in health or new medications. Directed them to the # given and on instructions.   Pt states they will.  Instructions reviewed with pt and pt states understanding. Instructed to review again prior to procedure. Pt states they will.  Instructions sent by mail with coupon and by my chart

## 2023-02-26 NOTE — Telephone Encounter (Signed)
Attempt to reach pt for pre-visit. LM with call back #.  Will attempt to reach again in due to no other # listed in profile

## 2023-03-12 ENCOUNTER — Encounter: Payer: Self-pay | Admitting: Certified Registered Nurse Anesthetist

## 2023-03-15 ENCOUNTER — Ambulatory Visit (AMBULATORY_SURGERY_CENTER): Payer: Medicare HMO | Admitting: Gastroenterology

## 2023-03-15 ENCOUNTER — Encounter: Payer: Self-pay | Admitting: Gastroenterology

## 2023-03-15 VITALS — BP 106/66 | HR 65 | Temp 98.0°F | Resp 13 | Ht 64.0 in | Wt 144.0 lb

## 2023-03-15 DIAGNOSIS — Z1211 Encounter for screening for malignant neoplasm of colon: Secondary | ICD-10-CM

## 2023-03-15 MED ORDER — SODIUM CHLORIDE 0.9 % IV SOLN: 500.0000 mL | INTRAVENOUS | Status: DC

## 2023-03-15 NOTE — Op Note (Signed)
[facility] Patient Name: Allison Rangel Procedure Date: 03/15/2023 10:07 AM MRN: [id number] Endoscopist: Sherilyn Cooter L. Myrtie Neither , MD, [id number] Age: 72 Referring MD:  Date of Birth: 28-Aug-1951 Gender: Female Account #: [id number] Procedure:                Colonoscopy Indications:              Screening for colorectal malignant neoplasm                           Patient reports last colonoscopy at outside GI                            practice over 10 years ago and a negative Cologuard                            5 years ago Medicines:                Monitored Anesthesia Care Procedure:                Pre-Anesthesia Assessment:                           - Prior to the procedure, a History and Physical                            was performed, and patient medications and                            allergies were reviewed. The patient's tolerance of                            previous anesthesia was also reviewed. The risks                            and benefits of the procedure and the sedation                            options and risks were discussed with the patient.                            All questions were answered, and informed consent                            was obtained. Prior Anticoagulants: The patient has                            taken no anticoagulant or antiplatelet agents. ASA                            Grade Assessment: II - A patient with mild systemic                            disease. After reviewing the risks and benefits,  the patient was deemed in satisfactory condition to                            undergo the procedure.                           After obtaining informed consent, the colonoscope                            was passed under direct vision. Throughout the                            procedure, the patient's blood pressure, pulse, and                            oxygen saturations were monitored continuously. The                             Olympus CF-HQ190L [id number]) Colonoscope was                            introduced through the anus and advanced to the the                            terminal ileum, with identification of the                            appendiceal orifice and IC valve. The colonoscopy                            was performed without difficulty. The patient                            tolerated the procedure well. The quality of the                            bowel preparation was excellent. The terminal                            ileum, ileocecal valve, appendiceal orifice, and                            rectum were photographed. The bowel preparation                            used was SUPREP via split dose instruction. Scope In: 10:17:45 AM Scope Out: 10:31:49 AM Scope Withdrawal Time: 0 hours 10 minutes 52 seconds  Total Procedure Duration: 0 hours 14 minutes 4 seconds  Findings:                 The perianal and digital rectal examinations were                            normal.  Repeat examination of right colon under NBI                            performed.                           Internal hemorrhoids were found.                           The exam was otherwise without abnormality on                            direct and retroflexion views. Complications:            No immediate complications. Impression:               - Internal hemorrhoids.                           - The examination was otherwise normal on direct                            and retroflexion views.                           - No specimens collected. Recommendation:           - Patient has a contact number available for                            emergencies. The signs and symptoms of potential                            delayed complications were discussed with the                            patient. Return to normal activities tomorrow.                            Written  discharge instructions were provided to the                            patient.                           - Resume previous diet.                           - Continue present medications.                           - Repeat colonoscopy in 10 years for screening                            purposes (if healthy and willing). Recall reminder                            will be sent at that time to  then have risk/benefit                            discussion. [name], MD 03/15/2023 10:36:23 AM This report has been signed electronically.

## 2023-03-15 NOTE — Progress Notes (Signed)
Report given to PACU, vss 

## 2023-03-15 NOTE — Progress Notes (Signed)
Patient states there have been no changes to medical or surgical history since time of pre-visit. 

## 2023-03-15 NOTE — Progress Notes (Signed)
History and Physical:  This patient presents for endoscopic testing for: Encounter Diagnosis  Name Primary?   Special screening for malignant neoplasms, colon Yes   Reports last colonoscopy 10 years ago (? [facility]) - no report available Reports negative cologuard about 5 years ago Patient denies chronic abdominal pain, rectal bleeding, constipation or diarrhea.    Patient is otherwise without complaints or active issues today.   Past Medical History: Past Medical History:  Diagnosis Date   Allergy    Hyperlipidemia    Myocardial infarction [facility])      Past Surgical History: Past Surgical History:  Procedure Laterality Date   LEFT HEART CATH AND CORONARY ANGIOGRAPHY N/A 07/12/2022   Procedure: LEFT HEART CATH AND CORONARY ANGIOGRAPHY;  Surgeon: Corky Crafts, MD;  Location: [facility] INVASIVE CV LAB;  Service: Cardiovascular;  Laterality: N/A;   LUMBAR DISC SURGERY     7-8 years   WISDOM TOOTH EXTRACTION      Allergies: Allergies  Allergen Reactions   Bee Venom Hives and Itching   Sulfa Antibiotics     Other reaction(s): Not available   Sulfamethoxazole-Trimethoprim Other (See Comments)    Unknown reaction   Ivp Dye [Iodinated Contrast Media] Rash    Came home after getting it and was up all night. Hasn't had in 20 to 30 years.    Outpatient Meds: Current Outpatient Medications  Medication Sig Dispense Refill   aspirin EC 81 MG tablet Take 1 tablet (81 mg total) by mouth daily. Swallow whole. 30 tablet 12   atorvastatin (LIPITOR) 20 MG tablet Take 1 tablet (20 mg total) by mouth every evening. 90 tablet 3   Digestive Enzyme CAPS Orally     Latanoprost (XELPROS) 0.005 % EMUL Place 1 drop into both eyes at bedtime.     Magnesium 200 MG TABS 1 tablet Orally Once a day     Omega-3 Fatty Acids (OMEGA 3 PO) Take 2 capsules by mouth daily at 12 noon.     omeprazole (PRILOSEC) 10 MG capsule Take 10 mg by mouth daily.     Multiple Vitamins-Calcium (DAILY VITAMINS FOR  WOMEN PO) Take 1 tablet by mouth daily.     Current Facility-Administered Medications  Medication Dose Route Frequency Provider Last Rate Last Admin   0.9 %  sodium chloride infusion  500 mL Intravenous Continuous Danis, Starr Lake III, MD          ___________________________________________________________________ Objective   Exam:  BP 117/72   Pulse 72   Temp 98 F (36.7 C) (Skin)   Resp 12   Ht 5\' 4"  (1.626 m)   Wt 144 lb (65.3 kg)   SpO2 100%   BMI 24.72 kg/m   CV: regular , S1/S2 Resp: clear to auscultation bilaterally, normal RR and effort noted GI: soft, no tenderness, with active bowel sounds.   Assessment: Encounter Diagnosis  Name Primary?   Special screening for malignant neoplasms, colon Yes   Average risk for CRC  Plan: Colonoscopy   The benefits and risks of the planned procedure were described in detail with the patient or (when appropriate) their health care proxy.  Risks were outlined as including, but not limited to, bleeding, infection, perforation, adverse medication reaction leading to cardiac or pulmonary decompensation, pancreatitis (if ERCP).  The limitation of incomplete mucosal visualization was also discussed.  No guarantees or warranties were given.  The patient is appropriate for an endoscopic procedure in the ambulatory setting.   - Amada Jupiter, MD

## 2023-03-15 NOTE — Patient Instructions (Signed)
Repeat colonoscopy in 10 years for screening purposes (if healthy and willing).  Recall reminder will be sent at that time to then have risk/benefit discussion.  YOU HAD AN ENDOSCOPIC PROCEDURE TODAY AT THE [facility]:   Refer to the procedure report that was given to you for any specific questions about what was found during the examination.  If the procedure report does not answer your questions, please call your gastroenterologist to clarify.  If you requested that your care partner not be given the details of your procedure findings, then the procedure report has been included in a sealed envelope for you to review at your convenience later.  YOU SHOULD EXPECT: Some feelings of bloating in the abdomen. Passage of more gas than usual.  Walking can help get rid of the air that was put into your GI tract during the procedure and reduce the bloating. If you had a lower endoscopy (such as a colonoscopy or flexible sigmoidoscopy) you may notice spotting of blood in your stool or on the toilet paper. If you underwent a bowel prep for your procedure, you may not have a normal bowel movement for a few days.  Please Note:  You might notice some irritation and congestion in your nose or some drainage.  This is from the oxygen used during your procedure.  There is no need for concern and it should clear up in a day or so.  SYMPTOMS TO REPORT IMMEDIATELY:  Following lower endoscopy (colonoscopy or flexible sigmoidoscopy):  Excessive amounts of blood in the stool  Significant tenderness or worsening of abdominal pains  Swelling of the abdomen that is new, acute  Fever of 100F or higher  For urgent or emergent issues, a gastroenterologist can be reached at any hour by calling [phone]. Do not use MyChart messaging for urgent concerns.    DIET:  We do recommend a small meal at first, but then you may proceed to your regular diet.  Drink plenty of fluids but you should avoid alcoholic  beverages for 24 hours.  ACTIVITY:  You should plan to take it easy for the rest of today and you should NOT DRIVE or use heavy machinery until tomorrow (because of the sedation medicines used during the test).    FOLLOW UP: Our staff will call the number listed on your records the next business day following your procedure.  We will call around 7:15- 8:00 am to check on you and address any questions or concerns that you may have regarding the information given to you following your procedure. If we do not reach you, we will leave a message.     If any biopsies were taken you will be contacted by phone or by letter within the next 1-3 weeks.  Please call us at [phone] if you have not heard about the biopsies in 3 weeks.    SIGNATURES/CONFIDENTIALITY: You and/or your care partner have signed paperwork which will be entered into your electronic medical record.  These signatures attest to the fact that that the information above on your After Visit Summary has been reviewed and is understood.  Full responsibility of the confidentiality of this discharge information lies with you and/or your care-partner.

## 2023-03-18 ENCOUNTER — Telehealth: Payer: Self-pay

## 2023-03-18 NOTE — Telephone Encounter (Signed)
Attempted to reach patient for post-procedure follow-up call. No answer. Left message for her to please not hesitate to call if she has any questions/concerns regarding her care. 

## 2023-09-13 ENCOUNTER — Other Ambulatory Visit: Payer: Self-pay | Admitting: Cardiovascular Disease

## 2023-10-01 LAB — HM MAMMOGRAPHY

## 2023-10-08 ENCOUNTER — Other Ambulatory Visit: Payer: Self-pay | Admitting: Cardiovascular Disease

## 2023-11-11 ENCOUNTER — Other Ambulatory Visit: Payer: Self-pay | Admitting: Cardiovascular Disease

## 2024-03-22 ENCOUNTER — Other Ambulatory Visit: Payer: Self-pay

## 2024-03-22 ENCOUNTER — Emergency Department (HOSPITAL_COMMUNITY)

## 2024-03-22 ENCOUNTER — Encounter (HOSPITAL_COMMUNITY): Payer: Self-pay

## 2024-03-22 ENCOUNTER — Inpatient Hospital Stay (HOSPITAL_COMMUNITY)
Admission: EM | Admit: 2024-03-22 | Discharge: 2024-03-24 | DRG: 281 | Disposition: A | Discharge status: Home / Self Care | Attending: Cardiology | Admitting: Cardiology

## 2024-03-22 DIAGNOSIS — I214 Non-ST elevation (NSTEMI) myocardial infarction: Secondary | ICD-10-CM | POA: Diagnosis not present

## 2024-03-22 DIAGNOSIS — I5181 Takotsubo syndrome: Secondary | ICD-10-CM | POA: Diagnosis present

## 2024-03-22 DIAGNOSIS — I493 Ventricular premature depolarization: Secondary | ICD-10-CM | POA: Diagnosis present

## 2024-03-22 DIAGNOSIS — I2510 Atherosclerotic heart disease of native coronary artery without angina pectoris: Secondary | ICD-10-CM | POA: Diagnosis present

## 2024-03-22 DIAGNOSIS — R079 Chest pain, unspecified: Secondary | ICD-10-CM | POA: Diagnosis not present

## 2024-03-22 DIAGNOSIS — I252 Old myocardial infarction: Secondary | ICD-10-CM

## 2024-03-22 DIAGNOSIS — R7989 Other specified abnormal findings of blood chemistry: Secondary | ICD-10-CM | POA: Diagnosis present

## 2024-03-22 DIAGNOSIS — Z91041 Radiographic dye allergy status: Secondary | ICD-10-CM

## 2024-03-22 DIAGNOSIS — K219 Gastro-esophageal reflux disease without esophagitis: Secondary | ICD-10-CM | POA: Diagnosis present

## 2024-03-22 DIAGNOSIS — Z7982 Long term (current) use of aspirin: Secondary | ICD-10-CM

## 2024-03-22 DIAGNOSIS — Z79899 Other long term (current) drug therapy: Secondary | ICD-10-CM

## 2024-03-22 DIAGNOSIS — Z823 Family history of stroke: Secondary | ICD-10-CM

## 2024-03-22 DIAGNOSIS — E785 Hyperlipidemia, unspecified: Secondary | ICD-10-CM | POA: Diagnosis present

## 2024-03-22 DIAGNOSIS — Z882 Allergy status to sulfonamides status: Secondary | ICD-10-CM

## 2024-03-22 MED ORDER — PROCHLORPERAZINE EDISYLATE 10 MG/2ML IJ SOLN
10.0000 mg | Freq: Once | INTRAMUSCULAR | Status: AC
  Administered 2024-03-22: 10 mg via INTRAVENOUS
  Filled 2024-03-22: qty 2

## 2024-03-22 MED ORDER — DIPHENHYDRAMINE HCL 50 MG/ML IJ SOLN
25.0000 mg | Freq: Once | INTRAMUSCULAR | Status: AC
  Administered 2024-03-22: 25 mg via INTRAVENOUS
  Filled 2024-03-22: qty 1

## 2024-03-22 NOTE — ED Triage Notes (Signed)
 Pt c/o chest pain and discomfort that started after swallowing a piece of ice and thinks it went down lungs. Took 4 baby aspirin  PTA.

## 2024-03-23 ENCOUNTER — Encounter (HOSPITAL_COMMUNITY): Payer: Self-pay | Admitting: Internal Medicine

## 2024-03-23 ENCOUNTER — Inpatient Hospital Stay (HOSPITAL_COMMUNITY)

## 2024-03-23 ENCOUNTER — Encounter (HOSPITAL_COMMUNITY)
Admission: EM | Disposition: A | Discharge status: Home / Self Care | Payer: Self-pay | Source: Home / Self Care | Attending: Cardiology

## 2024-03-23 DIAGNOSIS — Z823 Family history of stroke: Secondary | ICD-10-CM | POA: Diagnosis not present

## 2024-03-23 DIAGNOSIS — Z79899 Other long term (current) drug therapy: Secondary | ICD-10-CM | POA: Diagnosis not present

## 2024-03-23 DIAGNOSIS — I252 Old myocardial infarction: Secondary | ICD-10-CM | POA: Diagnosis not present

## 2024-03-23 DIAGNOSIS — Z91041 Radiographic dye allergy status: Secondary | ICD-10-CM | POA: Diagnosis not present

## 2024-03-23 DIAGNOSIS — E876 Hypokalemia: Secondary | ICD-10-CM | POA: Diagnosis not present

## 2024-03-23 DIAGNOSIS — I2510 Atherosclerotic heart disease of native coronary artery without angina pectoris: Secondary | ICD-10-CM | POA: Diagnosis present

## 2024-03-23 DIAGNOSIS — I5181 Takotsubo syndrome: Secondary | ICD-10-CM | POA: Diagnosis present

## 2024-03-23 DIAGNOSIS — I493 Ventricular premature depolarization: Secondary | ICD-10-CM | POA: Diagnosis present

## 2024-03-23 DIAGNOSIS — I214 Non-ST elevation (NSTEMI) myocardial infarction: Secondary | ICD-10-CM | POA: Diagnosis present

## 2024-03-23 DIAGNOSIS — I5030 Unspecified diastolic (congestive) heart failure: Secondary | ICD-10-CM | POA: Diagnosis not present

## 2024-03-23 DIAGNOSIS — R079 Chest pain, unspecified: Secondary | ICD-10-CM | POA: Diagnosis present

## 2024-03-23 DIAGNOSIS — Z743 Need for continuous supervision: Secondary | ICD-10-CM | POA: Diagnosis not present

## 2024-03-23 DIAGNOSIS — Z882 Allergy status to sulfonamides status: Secondary | ICD-10-CM | POA: Diagnosis not present

## 2024-03-23 DIAGNOSIS — K219 Gastro-esophageal reflux disease without esophagitis: Secondary | ICD-10-CM | POA: Diagnosis present

## 2024-03-23 DIAGNOSIS — Z7982 Long term (current) use of aspirin: Secondary | ICD-10-CM | POA: Diagnosis not present

## 2024-03-23 DIAGNOSIS — E785 Hyperlipidemia, unspecified: Secondary | ICD-10-CM | POA: Diagnosis present

## 2024-03-23 HISTORY — PX: LEFT HEART CATH AND CORONARY ANGIOGRAPHY: CATH118249

## 2024-03-23 LAB — LIPID PANEL
Cholesterol: 232 mg/dL — ABNORMAL HIGH (ref 0–200)
HDL: 94 mg/dL (ref >40)
LDL Cholesterol: 131 mg/dL — ABNORMAL HIGH (ref 0–99)
Total CHOL/HDL Ratio: 2.5 ratio
Triglycerides: 33 mg/dL (ref <150)
VLDL: 7 mg/dL (ref 0–40)

## 2024-03-23 LAB — BASIC METABOLIC PANEL WITH GFR
Anion gap: 16 — ABNORMAL HIGH (ref 5–15)
BUN: 15 mg/dL (ref 8–23)
CO2: 19 mmol/L — ABNORMAL LOW (ref 22–32)
Calcium: 9.3 mg/dL (ref 8.9–10.3)
Chloride: 97 mmol/L — ABNORMAL LOW (ref 98–111)
Creatinine, Ser: 0.7 mg/dL (ref 0.44–1.00)
GFR, Estimated: >60 mL/min (ref >60)
Glucose, Bld: 169 mg/dL — ABNORMAL HIGH (ref 70–99)
Potassium: 3.3 mmol/L — ABNORMAL LOW (ref 3.5–5.1)
Sodium: 132 mmol/L — ABNORMAL LOW (ref 135–145)

## 2024-03-23 LAB — ECHOCARDIOGRAM COMPLETE
AR max vel: 3.24 cm2
AV Area VTI: 3.09 cm2
AV Area mean vel: 2.96 cm2
AV Mean grad: 2 mmHg
AV Peak grad: 3.4 mmHg
Ao pk vel: 0.93 m/s
Area-P 1/2: 4.41 cm2
Calc EF: 38.3 %
Est EF: 30
Height: 64 in
S' Lateral: 2.1 cm
Single Plane A2C EF: 37.3 %
Single Plane A4C EF: 37.6 %
Weight: 2416.24 [oz_av]

## 2024-03-23 LAB — MAGNESIUM: Magnesium: 1.9 mg/dL (ref 1.7–2.4)

## 2024-03-23 LAB — CBC
HCT: 41.9 % (ref 36.0–46.0)
Hemoglobin: 14.3 g/dL (ref 12.0–15.0)
MCH: 31 pg (ref 26.0–34.0)
MCHC: 34.1 g/dL (ref 30.0–36.0)
MCV: 90.7 fL (ref 80.0–100.0)
Platelets: 178 10*3/uL (ref 150–400)
RBC: 4.62 MIL/uL (ref 3.87–5.11)
RDW: 13.2 % (ref 11.5–15.5)
WBC: 3.5 10*3/uL — ABNORMAL LOW (ref 4.0–10.5)
nRBC: 0 % (ref 0.0–0.2)

## 2024-03-23 LAB — HEPARIN LEVEL (UNFRACTIONATED): Heparin Unfractionated: 0.36 [IU]/mL (ref 0.30–0.70)

## 2024-03-23 LAB — TROPONIN I (HIGH SENSITIVITY)
Troponin I (High Sensitivity): 1155 ng/L (ref <18)
Troponin I (High Sensitivity): 2025 ng/L (ref <18)

## 2024-03-23 MED ORDER — VERAPAMIL HCL 2.5 MG/ML IV SOLN
INTRAVENOUS | Status: AC
  Filled 2024-03-23: qty 2

## 2024-03-23 MED ORDER — ONDANSETRON HCL 4 MG/2ML IJ SOLN: 4.0000 mg | Freq: Four times a day (QID) | INTRAMUSCULAR | Status: DC | PRN

## 2024-03-23 MED ORDER — MIDAZOLAM HCL 2 MG/2ML IJ SOLN
INTRAMUSCULAR | Status: AC
Start: 2024-03-23 — End: 2024-03-23
  Filled 2024-03-23: qty 2

## 2024-03-23 MED ORDER — POTASSIUM CHLORIDE CRYS ER 20 MEQ PO TBCR: 40.0000 meq | EXTENDED_RELEASE_TABLET | Freq: Two times a day (BID) | ORAL | Status: DC

## 2024-03-23 MED ORDER — IOHEXOL 350 MG/ML SOLN
INTRAVENOUS | Status: DC | PRN
Start: 2024-03-23 — End: 2024-03-23
  Administered 2024-03-23: 35 mL

## 2024-03-23 MED ORDER — SODIUM CHLORIDE 0.9 % WEIGHT BASED INFUSION
1.0000 mL/kg/h | INTRAVENOUS | Status: DC
  Administered 2024-03-23: 1 mL/kg/h via INTRAVENOUS

## 2024-03-23 MED ORDER — HEPARIN (PORCINE) IN NACL 1000-0.9 UT/500ML-% IV SOLN
INTRAVENOUS | Status: DC | PRN
  Administered 2024-03-23 (×2): 500 mL

## 2024-03-23 MED ORDER — LIDOCAINE HCL (PF) 1 % IJ SOLN
INTRAMUSCULAR | Status: AC
  Filled 2024-03-23: qty 30

## 2024-03-23 MED ORDER — HEPARIN SODIUM (PORCINE) 1000 UNIT/ML IJ SOLN
INTRAMUSCULAR | Status: DC | PRN
  Administered 2024-03-23: 3500 [IU] via INTRAVENOUS

## 2024-03-23 MED ORDER — ASPIRIN 81 MG PO TBEC
81.0000 mg | DELAYED_RELEASE_TABLET | Freq: Every day | ORAL | Status: DC
  Administered 2024-03-24: 81 mg via ORAL
  Filled 2024-03-23: qty 1

## 2024-03-23 MED ORDER — METHYLPREDNISOLONE SODIUM SUCC 125 MG IJ SOLR
125.0000 mg | Freq: Once | INTRAMUSCULAR | Status: AC
  Administered 2024-03-23: 125 mg via INTRAVENOUS
  Filled 2024-03-23: qty 2

## 2024-03-23 MED ORDER — ENOXAPARIN SODIUM 40 MG/0.4ML IJ SOSY
40.0000 mg | PREFILLED_SYRINGE | INTRAMUSCULAR | Status: DC
  Filled 2024-03-23: qty 0.4

## 2024-03-23 MED ORDER — SODIUM CHLORIDE 0.9% FLUSH: 3.0000 mL | INTRAVENOUS | Status: DC | PRN

## 2024-03-23 MED ORDER — FENTANYL CITRATE (PF) 100 MCG/2ML IJ SOLN
INTRAMUSCULAR | Status: AC
Start: 2024-03-23 — End: 2024-03-23
  Filled 2024-03-23: qty 2

## 2024-03-23 MED ORDER — DIPHENHYDRAMINE HCL 50 MG/ML IJ SOLN
INTRAMUSCULAR | Status: AC
  Filled 2024-03-23: qty 1

## 2024-03-23 MED ORDER — SODIUM CHLORIDE 0.9 % WEIGHT BASED INFUSION
3.0000 mL/kg/h | INTRAVENOUS | Status: DC
Start: 2024-03-24 — End: 2024-03-23

## 2024-03-23 MED ORDER — LIDOCAINE HCL (PF) 1 % IJ SOLN
INTRAMUSCULAR | Status: DC | PRN
  Administered 2024-03-23: 5 mL

## 2024-03-23 MED ORDER — CARVEDILOL 3.125 MG PO TABS
3.1250 mg | ORAL_TABLET | Freq: Two times a day (BID) | ORAL | Status: DC
  Administered 2024-03-24: 3.125 mg via ORAL
  Filled 2024-03-23: qty 1

## 2024-03-23 MED ORDER — DIPHENHYDRAMINE HCL 50 MG/ML IJ SOLN
50.0000 mg | Freq: Once | INTRAMUSCULAR | Status: AC
  Administered 2024-03-23: 50 mg via INTRAVENOUS

## 2024-03-23 MED ORDER — LOSARTAN POTASSIUM 25 MG PO TABS
12.5000 mg | ORAL_TABLET | Freq: Every day | ORAL | Status: DC
  Administered 2024-03-23 – 2024-03-24 (×2): 12.5 mg via ORAL
  Filled 2024-03-23 (×2): qty 1

## 2024-03-23 MED ORDER — NITROGLYCERIN 0.4 MG SL SUBL: 0.4000 mg | SUBLINGUAL_TABLET | SUBLINGUAL | Status: DC | PRN

## 2024-03-23 MED ORDER — HEPARIN (PORCINE) 25000 UT/250ML-% IV SOLN
800.0000 [IU]/h | INTRAVENOUS | Status: DC
  Administered 2024-03-23: 800 [IU]/h via INTRAVENOUS
  Filled 2024-03-23: qty 250

## 2024-03-23 MED ORDER — SODIUM CHLORIDE 0.9% FLUSH
3.0000 mL | Freq: Two times a day (BID) | INTRAVENOUS | Status: DC
  Administered 2024-03-23 – 2024-03-24 (×2): 3 mL via INTRAVENOUS

## 2024-03-23 MED ORDER — FENTANYL CITRATE (PF) 100 MCG/2ML IJ SOLN
INTRAMUSCULAR | Status: DC | PRN
Start: 2024-03-23 — End: 2024-03-23
  Administered 2024-03-23: 25 ug via INTRAVENOUS

## 2024-03-23 MED ORDER — HEPARIN BOLUS VIA INFUSION
3000.0000 [IU] | Freq: Once | INTRAVENOUS | Status: AC
  Administered 2024-03-23: 3000 [IU] via INTRAVENOUS

## 2024-03-23 MED ORDER — DIPHENHYDRAMINE HCL 25 MG PO CAPS: 50.0000 mg | ORAL_CAPSULE | Freq: Once | ORAL | Status: AC

## 2024-03-23 MED ORDER — MIDAZOLAM HCL 2 MG/2ML IJ SOLN
INTRAMUSCULAR | Status: DC | PRN
  Administered 2024-03-23: 1 mg via INTRAVENOUS

## 2024-03-23 MED ORDER — DIPHENHYDRAMINE HCL 50 MG/ML IJ SOLN: 50.0000 mg | Freq: Once | INTRAMUSCULAR | Status: DC

## 2024-03-23 MED ORDER — ACETAMINOPHEN 325 MG PO TABS: 650.0000 mg | ORAL_TABLET | ORAL | Status: DC | PRN

## 2024-03-23 MED ORDER — HEPARIN SODIUM (PORCINE) 1000 UNIT/ML IJ SOLN
INTRAMUSCULAR | Status: AC
  Filled 2024-03-23: qty 10

## 2024-03-23 MED ORDER — ASPIRIN 81 MG PO TBEC: 81.0000 mg | DELAYED_RELEASE_TABLET | Freq: Every day | ORAL | Status: DC

## 2024-03-23 MED ORDER — SODIUM CHLORIDE 0.9 % IV SOLN: 250.0000 mL | INTRAVENOUS | Status: DC | PRN

## 2024-03-23 MED ORDER — HYDRALAZINE HCL 20 MG/ML IJ SOLN: 10.0000 mg | INTRAMUSCULAR | Status: AC | PRN

## 2024-03-23 MED ORDER — PERFLUTREN LIPID MICROSPHERE
1.0000 mL | INTRAVENOUS | Status: AC | PRN
  Administered 2024-03-23: 2 mL via INTRAVENOUS

## 2024-03-23 MED ORDER — POTASSIUM CHLORIDE CRYS ER 20 MEQ PO TBCR
40.0000 meq | EXTENDED_RELEASE_TABLET | Freq: Four times a day (QID) | ORAL | Status: AC
  Administered 2024-03-23: 40 meq via ORAL
  Filled 2024-03-23: qty 2

## 2024-03-23 MED ORDER — METHYLPREDNISOLONE SODIUM SUCC 40 MG IJ SOLR: 40.0000 mg | Freq: Once | INTRAMUSCULAR | Status: DC

## 2024-03-23 MED ORDER — FENTANYL CITRATE PF 50 MCG/ML IJ SOSY
50.0000 ug | PREFILLED_SYRINGE | Freq: Once | INTRAMUSCULAR | Status: AC | PRN
  Administered 2024-03-23: 50 ug via INTRAVENOUS
  Filled 2024-03-23: qty 1

## 2024-03-23 MED ORDER — SODIUM CHLORIDE 0.9 % WEIGHT BASED INFUSION
3.0000 mL/kg/h | INTRAVENOUS | Status: DC
  Administered 2024-03-23: 3 mL/kg/h via INTRAVENOUS

## 2024-03-23 MED ORDER — SODIUM CHLORIDE 0.9 % WEIGHT BASED INFUSION: 1.0000 mL/kg/h | INTRAVENOUS | Status: DC

## 2024-03-23 MED ORDER — ASPIRIN 81 MG PO TBEC
81.0000 mg | DELAYED_RELEASE_TABLET | Freq: Every day | ORAL | Status: DC
  Administered 2024-03-23: 81 mg via ORAL
  Filled 2024-03-23: qty 1

## 2024-03-23 MED ORDER — VERAPAMIL HCL 2.5 MG/ML IV SOLN
INTRAVENOUS | Status: DC | PRN
  Administered 2024-03-23: 10 mL via INTRA_ARTERIAL

## 2024-03-23 NOTE — Progress Notes (Signed)
*  PRELIMINARY RESULTS* Echocardiogram 2D Echocardiogram has been performed.  Eva JONETTA Lash 03/23/2024, 11:15 AM

## 2024-03-23 NOTE — ED Notes (Signed)
 Assisted patient back to bed from restroom. Patient connected back to cardiac monitor at this time. Patient given warm blanket and yellow non skid socks.

## 2024-03-23 NOTE — Brief Op Note (Signed)
 03/23/2024  4:54 PM  PATIENT:  Allison Rangel  73 y.o. female  PRE-OPERATIVE DIAGNOSIS:  nstemi  POST-OPERATIVE DIAGNOSIS:  Takotsubo CM  PROCEDURE:  Procedure(s): LEFT HEART CATH AND CORONARY ANGIOGRAPHY (N/A)  SURGEON:  Surgeons and Role:    DEWAINE Mady Bruckner, MD - Primary  FINDINGS: Minimal CAD. Severely reduced LVEF with mid LV akinesis and relative sparing of apex and base. Mildly elevated LVEDP (20 mmHg).  RECOMMENDATIONS: Medical therapy of recurrent Takotsubo CM.  Bruckner Mady, MD [facility]

## 2024-03-23 NOTE — H&P (Addendum)
 Cardiology Admission History and Physical   Patient ID: Allison Rangel MRN: [id number]; DOB: 14-Dec-1950   Admission date: 03/22/2024  PCP:  Duanne Butler DASEN, MD   [facility] Providers Cardiologist:  Darryle DASEN Decent, MD    Chief Complaint:  Chest Pain  Patient Profile: Allison Rangel is a 73 y.o. 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  History of Present Illness: Ms. Likes was last seen in cardiology OV 08/2022 with Dr. Decent for follow-up.  At that time, patient doing well from a cardiac standpoint without any complaints.  Continued on ASA 81 mg daily, Lipitor 20 mg daily and Coreg  3.125 mg twice daily  Patient presented to [facility] ED today with concerns of chest pain.  K decreased 3.3, CR 0.7, TN 1155 >> 2025, WBC decreased 3.5.  CXR with no active disease. EKG showed NSR, HR 73, with very mild ST depression v4/5.  Cardiology fellow from [facility] admitted overnight, ordered cath with plan for transfer to [facility]. ED Treated with heparin  infusion and fentanyl  IV.   On interview, patient reported chest pain started yesterday at 9:30 pm after choking on ice cube, lasting 4-5 hours, described as pressure like someone kicked in my chest, located in mid sternal, does not radiate, severity 10/10, no exacerbated factors, and alleviated by pain medication. Associated with paresthesia down both arms, n/v, diaphoresis. Took 4 baby ASA prior to arrival. Compared to previous episode, CP was very similar but was more sick the last episode. Currently, no chest pain. Patient is very active, doing water aerobics and walking approx 3-4 per week without any anginal concerns.  Denied SOB, orthopnea, PND, palpitations, edema, dizziness, syncope. No recent trauma,  fever, or cough. She has not been taking any medications except vitamins.  She has a history of GERD but has not been taking PPIs due to concern of vitamin depletion side effect.  Reports brief episodes of acid reflux daily lasting less than minutes. Endorse healthy diet. Family history include CVA (father).    Past Medical History:  Diagnosis Date   Allergy    Hyperlipidemia    Myocardial infarction [facility])    Past Surgical History:  Procedure Laterality Date   LEFT HEART CATH AND CORONARY ANGIOGRAPHY N/A 07/12/2022   Procedure: LEFT HEART CATH AND CORONARY ANGIOGRAPHY;  Surgeon: Dann Candyce RAMAN, MD;  Location: [facility] INVASIVE CV LAB;  Service: Cardiovascular;  Laterality: N/A;   LUMBAR DISC SURGERY     7-8 years   WISDOM TOOTH EXTRACTION       Medications Prior to Admission: Prior to Admission medications   Medication Sig Start Date End Date Taking? Authorizing Provider  aspirin  EC 81 MG tablet Take 1 tablet (81 mg total) by mouth daily. Swallow whole. 07/14/22   Vicci Rollo JONELLE, PA-C  atorvastatin  (LIPITOR) 20 MG tablet TAKE 1 TABLET BY MOUTH EVERY DAY IN THE EVENING 11/11/23   O'Neal, Darryle Ned, MD  Digestive Enzyme CAPS Orally    [provider]  Latanoprost (XELPROS) 0.005 % EMUL Place 1 drop into both eyes at bedtime.    [provider]  Magnesium 200 MG TABS 1 tablet Orally Once a day    [provider]  Multiple Vitamins-Calcium  (DAILY VITAMINS FOR WOMEN PO) Take 1 tablet by  mouth daily.    [provider]  Omega-3 Fatty Acids (OMEGA 3 PO) Take 2 capsules by mouth daily at 12 noon.    [provider]  omeprazole (PRILOSEC) 10 MG capsule Take 10 mg by mouth daily.    [provider]     Allergies:    Allergies  Allergen Reactions   Bee Venom Hives and Itching   Sulfa Antibiotics     Other reaction(s): Not available   Sulfamethoxazole-Trimethoprim Other (See Comments)    Unknown reaction   Ivp Dye [Iodinated  Contrast Media] Rash    Came home after getting it and was up all night. Hasn't had in 20 to 30 years.    Social History:   Social History   Socioeconomic History   Marital status: Married    Spouse name: Not on file   Number of children: Not on file   Years of education: Not on file   Highest education level: Not on file  Occupational History   Not on file  Tobacco Use   Smoking status: Never   Smokeless tobacco: Never  Substance and Sexual Activity   Alcohol use: Yes    Comment: rare   Drug use: Never   Sexual activity: Not on file  Other Topics Concern   Not on file  Social History Narrative   Not on file   Social Drivers of Health   Financial Resource Strain: Low Risk  (01/17/2023)   Received from [facility]   Overall Financial Resource Strain (CARDIA)    Difficulty of Paying Living Expenses: Not hard at all  Food Insecurity: No Food Insecurity (01/17/2023)   Received from [facility]   Hunger Vital Sign    Within the past 12 months, you worried that your food would run out before you got the money to buy more.: Never true    Within the past 12 months, the food you bought just didn't last and you didn't have money to get more.: Never true  Transportation Needs: No Transportation Needs (01/17/2023)   Received from [facility] - Transportation    Lack of Transportation (Medical): No    Lack of Transportation (Non-Medical): No  Physical Activity: Sufficiently Active (01/17/2023)   Received from [facility]   Exercise Vital Sign    On average, how many days per week do you engage in moderate to strenuous exercise (like a brisk walk)?: 3 days    On average, how many minutes do you engage in exercise at this level?: 60 min  Stress: No Stress Concern Present (01/17/2023)   Received from [facility] - Occupational Stress Questionnaire    Feeling of Stress : Not at all  Social Connections: Socially Integrated  (01/17/2023)   Received from [facility]   Social Network    How would you rate your social network (family, work, friends)?: Good participation with social networks  Intimate Partner Violence: Not At Risk (01/17/2023)   Received from [facility]   HITS    Over the last 12 months how often did your partner physically hurt you?: Never    Over the last 12 months how often did your partner insult you or talk down to you?: Rarely    Over the last 12 months how often did your partner threaten you with physical harm?: Never    Over the last 12 months how often did your partner scream or curse at you?: Never  Family History:   The patient's family history includes Stroke in her father. There is no history of Colon cancer, Colon polyps, Esophageal cancer, Rectal cancer, or Stomach cancer.     ROS:  Please see the history of present illness.  All other ROS reviewed and negative.     Physical Exam/Data: Vitals:   03/23/24 0600 03/23/24 0700 03/23/24 0755 03/23/24 0840  BP: 130/88 126/89  (!) 133/94  Pulse: 86 83  89  Resp: 20 (!) 21  (!) 23  Temp:   98.1 F (36.7 C)   TempSrc:   Oral   SpO2: 90% 98%  95%  Weight:      Height:       No intake or output data in the 24 hours ending 03/23/24 0845    03/23/2024    2:08 AM 03/15/2023    9:14 AM 02/26/2023    2:10 PM  Last 3 Weights  Weight (lbs) 151 lb 0.2 oz 144 lb 144 lb  Weight (kg) 68.5 kg 65.318 kg 65.318 kg     Body mass index is 25.92 kg/m.  General:  Well nourished, well developed, in no acute distress HEENT: normal Neck: no JVD Vascular: No carotid bruits; Distal pulses 2+ bilaterally   Cardiac:  normal S1, S2; RRR; no murmur  Lungs:  clear to auscultation bilaterally, no wheezing, rhonchi or rales  Abd: soft, nontender, no hepatomegaly  Ext: no edema Musculoskeletal:  No deformities, BUE and BLE strength normal and equal Skin: warm and dry  Neuro:  CNs 2-12 intact, no focal abnormalities noted Psych:  Normal  affect   EKG:  The ECG that was done  was personally reviewed and demonstrates NSR, HR 73, with very mild ST depression v4/5.  Relevant CV Studies: ECHO 08/2022 IMPRESSIONS   1. Left ventricular ejection fraction, by estimation, is 50 to 55%. Left  ventricular ejection fraction by 3D volume is 50 %. The left ventricle has  low normal function. The left ventricle demonstrates regional wall motion  abnormalities (see scoring  diagram/findings for description). Left ventricular diastolic parameters  are consistent with Grade I diastolic dysfunction (impaired relaxation).  There is mild hypokinesis of the left ventricular, apical apical segment.  The average left ventricular  global longitudinal strain is -16.7 %. The global longitudinal strain is  abnormal.   2. Right ventricular systolic function is normal. The right ventricular  size is normal.   3. The mitral valve is abnormal. Trivial mitral valve regurgitation.   4. The aortic valve is tricuspid. Aortic valve regurgitation is not  visualized.   Comparison(s): Changes from prior study are noted. 07/13/2022: LVEF  45-50%, anteroseptal, septal hypokinesis.   Cath 07/2022 Conclusion   There is moderate left ventricular systolic dysfunction.   LV end diastolic pressure is normal.   The left ventricular ejection fraction is 35-45% by visual estimate.  LV dysunction with apical hypokinesis in a pattern consistnet with Takotsubo cardiomyopathy.   There is no aortic valve stenosis.   Minimal coronary atherosclerosis.   Medical therapy for stress induced cardiomyopathy.    Laboratory Data: High Sensitivity Troponin:   Recent Labs  Lab 03/23/24 0010 03/23/24 0201  TROPONINIHS 1,155* 2,025*      Chemistry Recent Labs  Lab 03/23/24 0010  NA 132*  K 3.3*  CL 97*  CO2 19*  GLUCOSE 169*  BUN 15  CREATININE 0.70  CALCIUM  9.3  GFRNONAA >60  ANIONGAP 16*    No results for input(s): PROT, ALBUMIN, AST, ALT,  ALKPHOS, BILITOT in the last 168 hours. Lipids No results for input(s): CHOL, TRIG, HDL, LABVLDL, LDLCALC, CHOLHDL in the last 168 hours. Hematology Recent Labs  Lab 03/23/24 0010  WBC 3.5*  RBC 4.62  HGB 14.3  HCT 41.9  MCV 90.7  MCH 31.0  MCHC 34.1  RDW 13.2  PLT 178   Thyroid  No results for input(s): TSH, FREET4 in the last 168 hours. BNPNo results for input(s): BNP, PROBNP in the last 168 hours.  DDimer No results for input(s): DDIMER in the last 168 hours.  Radiology/Studies:  DG Chest Port 1 View Result Date: 03/22/2024 CLINICAL DATA:  Chest pain EXAM: PORTABLE CHEST 1 VIEW COMPARISON:  07/12/2022 FINDINGS: Stable elevation of right hemidiaphragm. Lungs are clear. No pneumothorax or pleural effusion. Cardiac size within normal limits. Pulmonary vascularity is normal. No acute bone abnormality IMPRESSION: 1. No active disease. Electronically Signed   By: Dorethia Molt M.D.   On: 03/22/2024 23:23    Assessment and Plan: Chest pain/NSTEMI Nonobstructive CAD Cath 07/2022 showed moderate LV systolic dysfunction, EF 35 to 40%, C/W Takotsubo cardiomyopathy and minimal coronary atherosclerosis. Echo 08/2022 showed EF of 50 to 55%, low normal LV function, + RWMA (mild hypokinesis of the LV apical), G1 DD and trivial MVR.  Patient reported chest pain started yesterday at 9:30 pm after choking on ice cube, lasting 4-5 hours, described as pressure like someone kicked in my chest, located in mid sternal, does not radiate, severity 10/10, no exacerbated factors, and alleviated by pain medication. Associated with paresthesia down both arms, n/v, diaphoresis. Took 4 baby ASA prior to arrival. Compared to previous episode, CP was very similar but was more sick the last episode. Currently, no chest pain. Treated with IV heparin  and IV fentanyl .  EKG showed NSR, HR 73, with very mild ST depression v4/5. Tele showed NSR, HR 70 to 90s, with occasional PVCs and  ventricular bigeminy. TN 1155 >> 2025 Cath already ordered by fellow at [facility]  I agree to proceed with cath. Discussed catheterization.  Patient agrees to proceed and is aware she will be transferred to [facility]. I will put in admission ordered and facilitate transfer.  Patient has IV dye allergy listed in chart that notes rash and itching with a contrast study at least 40 years ago. Prior to last cath,she received prophylaxis. She does not recall any allergic reaction during previous cath.  Will order 4 hour contrast allergy prophylaxis.  Offered statin medication but patient declined for now unless necessary based on lipid panel and cath results. FLP pending.   Informed Consent   Shared Decision Making/Informed Consent The risks [stroke (1 in 1000), death (1 in 1000), kidney failure [usually temporary] (1 in 500), bleeding (1 in 200), allergic reaction [possibly serious] (1 in 200)], benefits (diagnostic support and management of coronary artery disease) and alternatives of a cardiac catheterization were discussed in detail with Ms. Jobin and she is willing to proceed.  HFimpEF  Takotsubo cardiomyopathy Nonischemic cardiomyopathy Cath & ECHO as above. ECHO pending.  Previously noted, unable to tolerate any GDMT due to low BP. Euvolemic on exam. No need for diuresis.  Can reassess based on ECHO results for additional GDMT.   Hypokalemia  K 3.3, Mg pending  Ordered K supplement   Risk Assessment/Risk Scores:   TIMI Risk Score for Unstable Angina or Non-ST Elevation MI:   The patient's TIMI risk score is 2, which indicates a 8% risk of all cause mortality, new or recurrent myocardial  infarction or need for urgent revascularization in the next 14 days.{    Code Status: Full Code  Severity of Illness: The appropriate patient status for this patient is INPATIENT. Inpatient status is judged to be reasonable and necessary in order to provide the required intensity of service to  ensure the patient's safety. The patient's presenting symptoms, physical exam findings, and initial radiographic and laboratory data in the context of their chronic comorbidities is felt to place them at high risk for further clinical deterioration. Furthermore, it is not anticipated that the patient will be medically stable for discharge from the hospital within 2 midnights of admission.   * I certify that at the point of admission it is my clinical judgment that the patient will require inpatient hospital care spanning beyond 2 midnights from the point of admission due to high intensity of service, high risk for further deterioration and high frequency of surveillance required.*  For questions or updates, please contact [facility] Please consult [email or web address] for contact info under     Signed, Lorette CINDERELLA Kapur, PA-C  03/23/2024 8:45 AM    Attending Note   Patient seen and discussed with PA Kapur, I agree with her documentation. 73 yo female history of Takotsubo CM in 2023 with later normalization of LVEF, minimal nonobstructive CAD by 2023 cath, presents with chest pain. Was drinking a cup of water and started coughing and choking on water/ice. 8-10/10 chest pressure midchest started after, lasted about 3 hours constant, worst with deep breathing. Improved with IV pain medications in ER.     WBC 3.5 Hgb 14.3 Plt 178 K 3.3 Cr 0.70 BUN 15 Trop 1155-->2025 EKG: SR, mild inferior ST depressions CXR: no acute process  07/2022 cath: minimal CAD, LV gram 35-45%, apical hypokinesis.  07/2022 echo: LVE 45-50%, grade I dd, apical hypokinesis 08/2022 echo: LVE 50-55%    1.NSTEMI - history of Takotsubo CM in 07/2022, elevated trop and apical hypokinesis on echo that later resolved. Cath at that time with minimal CAD - presents again with chest pain, elevated troponins. Differential would be ACS vs recurrent Takotsubo CM. Will require cath for further evaluation - medical therapy  with hep gtt, ASA 81 - mild dye allergy. From 2023 cath received IV solumedrol and benadryl  day of cath only without any issues, will write for doses today.   Plan for cath today. Have written for IV solumedrol, IV benadryl  today. Did well with one time dosing hours prior to her cath in 2023.    2. History of Takotsubo CM - admit 07/2022 with chest pain, elevated troponin - 07/2022 cath: minimal CAD, LV gram 35-45%, apical hypokinesis.  07/2022 echo: LVE 45-50%, grade I dd 08/2022 echo: LVE 50-55% - from notes GDMT at the time prohibited due to low bp's.   Dorn Ross MD

## 2024-03-23 NOTE — Plan of Care (Signed)
  Problem: Education: Goal: Knowledge of General Education information will improve Description: Including pain rating scale, medication(s)/side effects and non-pharmacologic comfort measures Outcome: Progressing   Problem: Clinical Measurements: Goal: Cardiovascular complication will be avoided Outcome: Progressing   Problem: Activity: Goal: Risk for activity intolerance will decrease Outcome: Progressing   Problem: Pain Managment: Goal: General experience of comfort will improve and/or be controlled Outcome: Progressing   Problem: Safety: Goal: Ability to remain free from injury will improve Outcome: Progressing

## 2024-03-23 NOTE — Progress Notes (Signed)
 PHARMACY - ANTICOAGULATION CONSULT NOTE  Pharmacy Consult for heparin  Indication: NSTEMI  Allergies  Allergen Reactions   Bee Venom Hives and Itching   Sulfa Antibiotics     Other reaction(s): Not available   Sulfamethoxazole-Trimethoprim Other (See Comments)    Unknown reaction   Ivp Dye [Iodinated Contrast Media] Rash    Came home after getting it and was up all night. Hasn't had in 20 to 30 years.    Patient Measurements: Height: 5' 4 (162.6 cm) Weight: 68.5 kg (151 lb 0.2 oz) IBW/kg (Calculated) : 54.7 HEPARIN  DW (KG): 68.4  Vital Signs: BP: 129/80 (06/23 0200) Pulse Rate: 78 (06/23 0200)  Labs: Recent Labs    03/23/24 0010  HGB 14.3  HCT 41.9  PLT 178  CREATININE 0.70  TROPONINIHS 1,155*    Estimated Creatinine Clearance: 61.3 mL/min (by C-G formula based on SCr of 0.7 mg/dL).   Medical History: Past Medical History:  Diagnosis Date   Allergy    Hyperlipidemia    Myocardial infarction [facility])    Assessment: 73yo female c/o central CP radiating to BUE, troponin found to be elevated >> to begin heparin .  Goal of Therapy:  Heparin  level 0.3-0.7 units/ml Monitor platelets by anticoagulation protocol: Yes   Plan:  Heparin  3000 units IV bolus followed by infusion at 800 units/hr. Monitor heparin  levels and CBC.  Marvetta Dauphin, PharmD, BCPS  03/23/2024,2:16 AM

## 2024-03-23 NOTE — ED Provider Notes (Signed)
 AP-EMERGENCY DEPT [facility] Emergency Department Provider Note MRN:  [id number]  Arrival date & time: 03/23/24     Chief Complaint   Chest pain History of Present Illness   Allison Rangel is a 73 y.o. year-old female with a history of NSTEMI presenting to the ED with chief complaint of chest pain.  Patient endorsing central chest pressure with radiation down both arms.  Occurred this evening shortly after choking on a piece of ice.  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  Review of Systems  A thorough review of systems was obtained and all systems are negative except as noted in the HPI and PMH.   Patient's Health History    Past Medical History:  Diagnosis Date   Allergy    Hyperlipidemia    Myocardial infarction [facility])     Past Surgical History:  Procedure Laterality Date   LEFT HEART CATH AND CORONARY ANGIOGRAPHY N/A 07/12/2022   Procedure: LEFT HEART CATH AND CORONARY ANGIOGRAPHY;  Surgeon: Dann Candyce RAMAN, MD;  Location: [facility] INVASIVE CV LAB;  Service: Cardiovascular;  Laterality: N/A;   LUMBAR DISC SURGERY     7-8 years   WISDOM TOOTH EXTRACTION      Family History  Problem Relation Age of Onset   Colon cancer Neg Hx    Colon polyps Neg Hx    Esophageal cancer Neg Hx    Rectal cancer Neg Hx    Stomach cancer Neg Hx     Social History   Socioeconomic History   Marital status: Married    Spouse name: Not on file   Number of children: Not on file   Years of education: Not on file   Highest education level: Not on file  Occupational History   Not on file  Tobacco Use   Smoking status: Never   Smokeless tobacco: Never  Substance and Sexual Activity   Alcohol use: Yes    Comment: rare   Drug use: Never   Sexual activity: Not on file  Other Topics Concern   Not on file  Social History Narrative   Not on file   Social Drivers of Health   Financial Resource Strain: Low Risk  (01/17/2023)   Received from [facility]   Overall Financial Resource Strain (CARDIA)    Difficulty of Paying Living Expenses: Not hard at all  Food Insecurity: No Food Insecurity (01/17/2023)   Received from [facility]   Hunger Vital Sign    Within the past 12 months, you worried that your food would run out before you got the money to buy more.: Never true    Within the past 12 months, the food you bought just didn't last and you didn't have money to get more.: Never true  Transportation Needs: No Transportation Needs (01/17/2023)   Received from [facility] - Transportation    Lack of Transportation (Medical): No    Lack of Transportation (Non-Medical): No  Physical Activity: Sufficiently Active (01/17/2023)   Received from [facility]   Exercise Vital Sign    On average, how many days per week do you engage in moderate to strenuous exercise (like a brisk walk)?: 3 days    On average, how many minutes do you engage in exercise at this level?: 60 min  Stress: No Stress Concern Present (01/17/2023)   Received from [facility] - Occupational Stress Questionnaire    Feeling  of Stress : Not at all  Social Connections: Socially Integrated (01/17/2023)   Received from [facility]   Social Network    How would you rate your social network (family, work, friends)?: Good participation with social networks  Intimate Partner Violence: Not At Risk (01/17/2023)   Received from [facility]   HITS    Over the last 12 months how often did your partner physically hurt you?: Never    Over the last 12 months how often did your partner insult you or talk down to you?: Rarely    Over the last 12 months how often did your partner threaten you with physical harm?: Never    Over the last 12 months how often did your partner scream or curse at you?: Never     Physical Exam   Vitals:   03/23/24 0700 03/23/24 0755  BP: 126/89   Pulse: 83   Resp: (!) 21   Temp:  98.1 F  (36.7 C)  SpO2: 98%     CONSTITUTIONAL: Well-appearing, NAD NEURO/PSYCH:  Alert and oriented x 3, no focal deficits EYES:  eyes equal and reactive ENT/NECK:  no LAD, no JVD CARDIO: Regular rate, well-perfused, normal S1 and S2 PULM:  CTAB no wheezing or rhonchi GI/GU:  non-distended, non-tender MSK/SPINE:  No gross deformities, no edema SKIN:  no rash, atraumatic   *Additional and/or pertinent findings included in MDM below  Diagnostic and Interventional Summary    EKG Interpretation Date/Time:  Sunday March 22 2024 23:10:34 EDT Ventricular Rate:  73 PR Interval:  140 QRS Duration:  100 QT Interval:  415 QTC Calculation: 458 R Axis:   66  Text Interpretation: Sinus rhythm Confirmed by Theadore Sharper [phone]) on 03/23/2024 12:17:35 AM       Labs Reviewed  CBC - Abnormal; Notable for the following components:      Result Value   WBC 3.5 (*)    All other components within normal limits  BASIC METABOLIC PANEL WITH GFR - Abnormal; Notable for the following components:   Sodium 132 (*)    Potassium 3.3 (*)    Chloride 97 (*)    CO2 19 (*)    Glucose, Bld 169 (*)    Anion gap 16 (*)    All other components within normal limits  TROPONIN I (HIGH SENSITIVITY) - Abnormal; Notable for the following components:   Troponin I (High Sensitivity) 1,155 (*)    All other components within normal limits  TROPONIN I (HIGH SENSITIVITY) - Abnormal; Notable for the following components:   Troponin I (High Sensitivity) 2,025 (*)    All other components within normal limits  HEPARIN  LEVEL (UNFRACTIONATED)  MAGNESIUM    DG Chest Port 1 View  Final Result      Medications  heparin  ADULT infusion 100 units/mL (25000 units/250mL) (800 Units/hr Intravenous New Bag/Given 03/23/24 0252)  diphenhydrAMINE  (BENADRYL ) injection 25 mg (25 mg Intravenous Given 03/22/24 2331)  prochlorperazine (COMPAZINE) injection 10 mg (10 mg Intravenous Given 03/22/24 2331)  fentaNYL  (SUBLIMAZE ) injection 50 mcg  (50 mcg Intravenous Given 03/23/24 0021)  heparin  bolus via infusion 3,000 Units (3,000 Units Intravenous Bolus from Bag 03/23/24 0252)     Procedures  /  Critical Care .Critical Care  Performed by: Theadore Sharper HERO, MD Authorized by: Theadore Sharper HERO, MD   Critical care provider statement:    Critical care time (minutes):  45   Critical care was necessary to treat or prevent imminent or life-threatening deterioration of the following  conditions: NSTEMI.   Critical care was time spent personally by me on the following activities:  Development of treatment plan with patient or surrogate, discussions with consultants, evaluation of patient's response to treatment, examination of patient, ordering and review of laboratory studies, ordering and review of radiographic studies, ordering and performing treatments and interventions, pulse oximetry, re-evaluation of patient's condition and review of old charts   ED Course and Medical Decision Making  Initial Impression and Ddx Differential diagnosis includes ACS, recurrence of Takotsubo cardiomyopathy, GERD, esophageal spasm, less likely PE or dissection.  Past medical/surgical history that increases complexity of ED encounter: Takotsubo cardiomyopathy  Interpretation of Diagnostics I personally reviewed the EKG and my interpretation is as follows: Sinus rhythm  No significant blood count or electrolyte disturbance.  Troponin elevated  Patient Reassessment and Ultimate Disposition/Management     Case discussed with Dr. Darin with cardiology, plan is for admission by cardiology at [facility].  Some continued pain, placing on heparin  drip.  Patient management required discussion with the following services or consulting groups:  Cardiology  Complexity of Problems Addressed Acute illness or injury that poses threat of life of bodily function  Additional Data Reviewed and Analyzed Further history obtained from: Recent Consult notes  Additional  Factors Impacting ED Encounter Risk Consideration of hospitalization  Ozell HERO. Theadore, MD [facility] Emergency Medicine [facility] [email or web address]  Final Clinical Impressions(s) / ED Diagnoses     ICD-10-CM   1. NSTEMI (non-ST elevated myocardial infarction) [facility])  I21.4       ED Discharge Orders     None        Discharge Instructions Discussed with and Provided to Patient:   Discharge Instructions   None      Theadore Ozell HERO, MD 03/23/24 [phone]

## 2024-03-23 NOTE — Progress Notes (Signed)
 PHARMACY - ANTICOAGULATION CONSULT NOTE  Pharmacy Consult for heparin  Indication: NSTEMI  Allergies  Allergen Reactions   Bee Venom Hives and Itching   Sulfa Antibiotics     Other reaction(s): Not available   Sulfamethoxazole-Trimethoprim Other (See Comments)    Unknown reaction   Ivp Dye [Iodinated Contrast Media] Rash    Came home after getting it and was up all night. Hasn't had in 20 to 30 years.    Patient Measurements: Height: 5' 4 (162.6 cm) Weight: 68.5 kg (151 lb 0.2 oz) IBW/kg (Calculated) : 54.7 HEPARIN  DW (KG): 68.4  Vital Signs: Temp: 98.1 F (36.7 C) (06/23 0755) Temp Source: Oral (06/23 0755) BP: 133/94 (06/23 0840) Pulse Rate: 89 (06/23 0840)  Labs: Recent Labs    03/23/24 0010 03/23/24 0201 03/23/24 0832  HGB 14.3  --   --   HCT 41.9  --   --   PLT 178  --   --   HEPARINUNFRC  --   --  0.36  CREATININE 0.70  --   --   TROPONINIHS 1,155* 2,025*  --     Estimated Creatinine Clearance: 61.3 mL/min (by C-G formula based on SCr of 0.7 mg/dL).   Medical History: Past Medical History:  Diagnosis Date   Allergy    Hyperlipidemia    Myocardial infarction [facility])    Assessment: 73yo female c/o central CP radiating to BUE, troponin found to be elevated >> to begin heparin . Plan for cath today at [facility] HL 0.36, therapeutic, no issues with infusion Repeat HL in ~6-8 hrs for confirmatory level  Goal of Therapy:  Heparin  level 0.3-0.7 units/ml Monitor platelets by anticoagulation protocol: Yes   Plan:  Continue heparin  infusion at 800 units/hr. Anti-xa level in ~6-8 hrs for confirmatory level Monitor heparin  levels and CBC.  Rokia Bosket, BS Pharm D, BCPS Clinical Pharmacist 03/23/2024,9:54 AM

## 2024-03-23 NOTE — Progress Notes (Signed)
 Patient from Allison Rangel To cath lab. No intervention. TR band to right radial wrist. 2 nl left per PACU nurse. MP shows NSR. Patient denies complaints. VSS. Patient is alert and oriented x4. Skin assessment done with Jordyn RN. No skin issues noted. IV saline locked.

## 2024-03-23 NOTE — Interval H&P Note (Signed)
 History and Physical Interval Note:  03/23/2024 3:27 PM  Allison Rangel  has presented today for surgery, with the diagnosis of NSTEMI.  The various methods of treatment have been discussed with the patient and family. After consideration of risks, benefits and other options for treatment, the patient has consented to  Procedure(s): LEFT HEART CATH AND CORONARY ANGIOGRAPHY (N/A) as a surgical intervention.  The patient's history has been reviewed, patient examined, no change in status, stable for surgery.  I have reviewed the patient's chart and labs.  Questions were answered to the patient's satisfaction.    Cath Lab Visit (complete for each Cath Lab visit)  Clinical Evaluation Leading to the Procedure:   ACS: Yes.    Non-ACS:  N/A  [name]

## 2024-03-23 NOTE — ED Notes (Signed)
 ED Provider at bedside.

## 2024-03-24 ENCOUNTER — Encounter (HOSPITAL_COMMUNITY): Payer: Self-pay | Admitting: Internal Medicine

## 2024-03-24 ENCOUNTER — Other Ambulatory Visit (HOSPITAL_COMMUNITY): Payer: Self-pay

## 2024-03-24 DIAGNOSIS — I5181 Takotsubo syndrome: Secondary | ICD-10-CM

## 2024-03-24 DIAGNOSIS — E785 Hyperlipidemia, unspecified: Secondary | ICD-10-CM | POA: Insufficient documentation

## 2024-03-24 LAB — BASIC METABOLIC PANEL WITH GFR
Anion gap: 9 (ref 5–15)
BUN: 11 mg/dL (ref 8–23)
CO2: 19 mmol/L — ABNORMAL LOW (ref 22–32)
Calcium: 9.1 mg/dL (ref 8.9–10.3)
Chloride: 102 mmol/L (ref 98–111)
Creatinine, Ser: 0.69 mg/dL (ref 0.44–1.00)
GFR, Estimated: >60 mL/min (ref >60)
Glucose, Bld: 153 mg/dL — ABNORMAL HIGH (ref 70–99)
Potassium: 4.5 mmol/L (ref 3.5–5.1)
Sodium: 130 mmol/L — ABNORMAL LOW (ref 135–145)

## 2024-03-24 MED ORDER — ROSUVASTATIN CALCIUM 20 MG PO TABS
20.0000 mg | ORAL_TABLET | Freq: Every day | ORAL | Status: DC
  Administered 2024-03-24: 20 mg via ORAL
  Filled 2024-03-24: qty 1

## 2024-03-24 MED ORDER — CARVEDILOL 3.125 MG PO TABS
3.1250 mg | ORAL_TABLET | Freq: Two times a day (BID) | ORAL | 3 refills | Status: DC
  Filled 2024-03-24: qty 180, 90d supply, fill #0

## 2024-03-24 MED ORDER — LOSARTAN POTASSIUM 25 MG PO TABS
12.5000 mg | ORAL_TABLET | Freq: Every day | ORAL | 3 refills | Status: DC
  Filled 2024-03-24: qty 45, 90d supply, fill #0

## 2024-03-24 MED ORDER — ASPIRIN 81 MG PO TBEC
81.0000 mg | DELAYED_RELEASE_TABLET | Freq: Every day | ORAL | 3 refills | Status: AC
  Filled 2024-03-24: qty 90, 90d supply, fill #0

## 2024-03-24 MED ORDER — NITROGLYCERIN 0.4 MG SL SUBL
0.4000 mg | SUBLINGUAL_TABLET | SUBLINGUAL | 3 refills | Status: AC | PRN
  Filled 2024-03-24: qty 25, 8d supply, fill #0

## 2024-03-24 MED ORDER — ROSUVASTATIN CALCIUM 20 MG PO TABS
20.0000 mg | ORAL_TABLET | Freq: Every day | ORAL | 3 refills | Status: AC
  Filled 2024-03-24: qty 90, 90d supply, fill #0

## 2024-03-24 NOTE — Progress Notes (Signed)
 Patient discharged with spouse. Patient to pick up meds at pharmacy on the way out.

## 2024-03-24 NOTE — TOC Transition Note (Signed)
 Transition of Care [facility]) - Discharge Note   Patient Details  Name: Allison Rangel MRN: [id number] Date of Birth: November 07, 1950  Transition of [facility]) CM/SW Contact:  Waddell Barnie Rama, RN Phone Number: 03/24/2024, 10:58 AM   Clinical Narrative:    For dc today, spouse at bedside to transport home.  Patient states the PA will be coming by to see her prior to dc.  She has no other needs.         Patient Goals and CMS Choice            Discharge Placement                       Discharge Plan and Services Additional resources added to the After Visit Summary for                                       Social Drivers of Health (SDOH) Interventions SDOH Screenings   Food Insecurity: No Food Insecurity (01/17/2023)   Received from [facility]  Housing: Low Risk  (07/13/2022)  Transportation Needs: No Transportation Needs (01/17/2023)   Received from [facility]  Utilities: Not At Risk (01/17/2023)   Received from [facility]  Financial Resource Strain: Low Risk  (01/17/2023)   Received from [facility]  Physical Activity: Sufficiently Active (01/17/2023)   Received from [facility]  Social Connections: Socially Integrated (01/17/2023)   Received from [facility]  Stress: No Stress Concern Present (01/17/2023)   Received from [facility]  Tobacco Use: Low Risk  (03/22/2024)     Readmission Risk Interventions    03/24/2024   10:57 AM  Readmission Risk Prevention Plan  Post Dischage Appt Complete  Medication Screening Complete  Transportation Screening Complete

## 2024-03-24 NOTE — Progress Notes (Signed)
 Rounding Note   Patient Name: Allison Rangel Date of Encounter: 03/24/2024  [facility] Cardiologist: Dr. Dorn Lesches  Subjective Denies chest pain or shortness of breath  Scheduled Meds:  aspirin  EC  81 mg Oral Daily   carvedilol   3.125 mg Oral BID WC   enoxaparin (LOVENOX) injection  40 mg Subcutaneous Q24H   losartan   12.5 mg Oral Daily   sodium chloride  flush  3 mL Intravenous Q12H   Continuous Infusions:  sodium chloride      PRN Meds: sodium chloride , acetaminophen , nitroGLYCERIN , ondansetron  (ZOFRAN ) IV, sodium chloride  flush   Vital Signs  Vitals:   03/23/24 1934 03/24/24 0036 03/24/24 0449 03/24/24 0735  BP: 114/69 95/63 100/64 93/66  Pulse: (!) 102 92 85 86  Resp: (!) 22 20 20 18   Temp: 98 F (36.7 C) 98.1 F (36.7 C) 97.7 F (36.5 C) 97.8 F (36.6 C)  TempSrc: Oral Oral Oral Oral  SpO2: 95% 94% 94% 90%  Weight:   65.1 kg   Height:        Intake/Output Summary (Last 24 hours) at 03/24/2024 9077 Last data filed at 03/24/2024 0450 Gross per 24 hour  Intake 891 ml  Output 700 ml  Net 191 ml      03/24/2024    4:49 AM 03/23/2024    6:56 PM 03/23/2024    2:08 AM  Last 3 Weights  Weight (lbs) 143 lb 9.6 oz 143 lb 4.8 oz 151 lb 0.2 oz  Weight (kg) 65.137 kg 65 kg 68.5 kg      Telemetry Sinus rhythm with PVCs- Personally Reviewed  ECG  Normal sinus rhythm at 84 with septal Q waves, inferior Q waves and lateral T wave inversion.- Personally Reviewed  Physical Exam  GEN: No acute distress.   Neck: No JVD Cardiac: RRR, no murmurs, rubs, or gallops.  Respiratory: Clear to auscultation bilaterally. GI: Soft, nontender, non-distended  MS: No edema; No deformity. Neuro:  Nonfocal  Psych: Normal affect   Labs High Sensitivity Troponin:   Recent Labs  Lab 03/23/24 0010 03/23/24 0201  TROPONINIHS 1,155* 2,025*     Chemistry Recent Labs  Lab 03/23/24 0010 03/23/24 0252 03/24/24 0228  NA 132*  --  130*  K 3.3*  --  4.5  CL  97*  --  102  CO2 19*  --  19*  GLUCOSE 169*  --  153*  BUN 15  --  11  CREATININE 0.70  --  0.69  CALCIUM  9.3  --  9.1  MG  --  1.9  --   GFRNONAA >60  --  >60  ANIONGAP 16*  --  9    Lipids  Recent Labs  Lab 03/23/24 0252  CHOL 232*  TRIG 33  HDL 94  LDLCALC 131*  CHOLHDL 2.5    Hematology Recent Labs  Lab 03/23/24 0010  WBC 3.5*  RBC 4.62  HGB 14.3  HCT 41.9  MCV 90.7  MCH 31.0  MCHC 34.1  RDW 13.2  PLT 178   Thyroid  No results for input(s): TSH, FREET4 in the last 168 hours.  BNPNo results for input(s): BNP, PROBNP in the last 168 hours.  DDimer No results for input(s): DDIMER in the last 168 hours.   Radiology  CARDIAC CATHETERIZATION Result Date: 03/23/2024 Conclusions: Nonobstructive coronary artery disease with minimal luminal irregularities in the RCA.  No significant CAD involving the left coronary artery.  Appearance is similar to last catheterization in 07/2022. Moderately to severely reduced  left ventricular systolic function with preserved basal and apical function and akinesis of the mid left ventricle consistent with Takotsubo variant (LVEF 30-35%). Mildly elevated left ventricular filling pressure (LVEDP 20 mmHg). Recommendations: Initiate goal-directed medical therapy; will add low-dose carvedilol  and losartan . Consider gentle diuresis beginning tomorrow as blood pressure and renal function allow. Medical therapy and risk factor modification to prevent progression of mild CAD. Lonni Hanson, MD [facility]  ECHOCARDIOGRAM COMPLETE Result Date: 03/23/2024    ECHOCARDIOGRAM REPORT   Patient Name:   Allison Rangel Date of Exam: 03/23/2024 Medical Rec #:  [id number]         Height:       64.0 in Accession #:    [id number]        Weight:       151.0 lb Date of Birth:  Feb 14, 1951         BSA:          1.736 m Patient Age:    73 years          BP:           130/91 mmHg Patient Gender: F                 HR:           91 bpm. Exam Location:  [facility] Procedure: 2D Echo, Color Doppler and Cardiac Doppler (Both Spectral and Color            Flow Doppler were utilized during procedure). Indications:    R07.9* Chest pain, unspecified  History:        Patient has prior history of Echocardiogram examinations, most                 recent 08/06/2022.  Sonographer:    Eva Lash Referring Phys: [id number] SCOTESIA Y DUNLAP IMPRESSIONS  1. Left ventricular ejection fraction, by estimation, is 30%. The left ventricle has moderately decreased function. The left ventricle demonstrates regional wall motion abnormalities (see scoring diagram/findings for description). Left ventricular diastolic parameters are consistent with Grade I diastolic dysfunction (impaired relaxation).  2. Right ventricular systolic function is normal. The right ventricular size is normal.  3. The mitral valve is normal in structure. No evidence of mitral valve regurgitation. No evidence of mitral stenosis.  4. The tricuspid valve is abnormal.  5. The aortic valve is tricuspid. Aortic valve regurgitation is not visualized. No aortic stenosis is present. FINDINGS  Left Ventricle: Left ventricular ejection fraction, by estimation, is 30%. The left ventricle has moderately decreased function. The left ventricle demonstrates regional wall motion abnormalities. Definity contrast agent was given IV to delineate the left ventricular endocardial borders. There is no left ventricular hypertrophy. Left ventricular diastolic parameters are consistent with Grade I diastolic dysfunction (impaired relaxation).  LV Wall Scoring: The mid anteroseptal segment is akinetic. The entire apex, basal anteroseptal segment, and mid inferoseptal segment are hypokinetic. Right Ventricle: Indeterminant PASP, IVC poorly visualized. The right ventricular size is normal. Right vetricular wall thickness was not well visualized. Right ventricular systolic function is normal. Left Atrium: Left atrial size was normal in size. Right  Atrium: Right atrial size was normal in size. Pericardium: Probably liver cyst. There is no evidence of pericardial effusion. Mitral Valve: The mitral valve is normal in structure. No evidence of mitral valve regurgitation. No evidence of mitral valve stenosis. Tricuspid Valve: The tricuspid valve is abnormal. Tricuspid valve regurgitation is mild . No evidence of tricuspid stenosis. Aortic Valve: The  aortic valve is tricuspid. Aortic valve regurgitation is not visualized. No aortic stenosis is present. Aortic valve mean gradient measures 2.0 mmHg. Aortic valve peak gradient measures 3.4 mmHg. Aortic valve area, by VTI measures 3.09 cm. Pulmonic Valve: The pulmonic valve was not well visualized. Pulmonic valve regurgitation is not visualized. No evidence of pulmonic stenosis. Aorta: The aortic root and ascending aorta are structurally normal, with no evidence of dilitation. Venous: The inferior vena cava was not well visualized. IAS/Shunts: The interatrial septum was not well visualized.  LEFT VENTRICLE PLAX 2D LVIDd:         4.10 cm      Diastology LVIDs:         2.10 cm      LV e' medial:    4.46 cm/s LV PW:         0.70 cm      LV E/e' medial:  11.3 LV IVS:        0.90 cm      LV e' lateral:   5.33 cm/s LVOT diam:     2.30 cm      LV E/e' lateral: 9.5 LV SV:         52 LV SV Index:   30 LVOT Area:     4.15 cm  LV Volumes (MOD) LV vol d, MOD A2C: 109.0 ml LV vol d, MOD A4C: 122.0 ml LV vol s, MOD A2C: 68.3 ml LV vol s, MOD A4C: 76.1 ml LV SV MOD A2C:     40.7 ml LV SV MOD A4C:     122.0 ml LV SV MOD BP:      45.3 ml RIGHT VENTRICLE RV S prime:     14.40 cm/s TAPSE (M-mode): 1.0 cm LEFT ATRIUM           Index LA diam:      3.30 cm 1.90 cm/m LA Vol (A2C): 43.8 ml 25.23 ml/m LA Vol (A4C): 24.2 ml 13.94 ml/m  AORTIC VALVE AV Area (Vmax):    3.24 cm AV Area (Vmean):   2.96 cm AV Area (VTI):     3.09 cm AV Vmax:           92.83 cm/s AV Vmean:          65.833 cm/s AV VTI:            0.168 m AV Peak Grad:      3.4  mmHg AV Mean Grad:      2.0 mmHg LVOT Vmax:         72.40 cm/s LVOT Vmean:        46.900 cm/s LVOT VTI:          0.125 m LVOT/AV VTI ratio: 0.74  AORTA Ao Root diam: 3.80 cm Ao Asc diam:  3.20 cm MITRAL VALVE               TRICUSPID VALVE MV Area (PHT): 4.41 cm    TR Peak grad:   19.4 mmHg MV Decel Time: 172 msec    TR Vmax:        220.00 cm/s MV E velocity: 50.40 cm/s MV A velocity: 63.30 cm/s  SHUNTS MV E/A ratio:  0.80        Systemic VTI:  0.12 m                            Systemic Diam: 2.30 cm Dorn Ross MD Electronically signed by Dorn  Branch MD Signature Date/Time: 03/23/2024/1:04:34 PM    Final    DG Chest Port 1 View Result Date: 03/22/2024 CLINICAL DATA:  Chest pain EXAM: PORTABLE CHEST 1 VIEW COMPARISON:  07/12/2022 FINDINGS: Stable elevation of right hemidiaphragm. Lungs are clear. No pneumothorax or pleural effusion. Cardiac size within normal limits. Pulmonary vascularity is normal. No acute bone abnormality IMPRESSION: 1. No active disease. Electronically Signed   By: Dorethia Molt M.D.   On: 03/22/2024 23:23    Cardiac Studies  2D echocardiogram (03/23/2024)  IMPRESSIONS     1. Left ventricular ejection fraction, by estimation, is 30%. The left  ventricle has moderately decreased function. The left ventricle  demonstrates regional wall motion abnormalities (see scoring  diagram/findings for description). Left ventricular  diastolic parameters are consistent with Grade I diastolic dysfunction  (impaired relaxation).   2. Right ventricular systolic function is normal. The right ventricular  size is normal.   3. The mitral valve is normal in structure. No evidence of mitral valve  regurgitation. No evidence of mitral stenosis.   4. The tricuspid valve is abnormal.   5. The aortic valve is tricuspid. Aortic valve regurgitation is not  visualized. No aortic stenosis is present.   Coronary angiography (03/23/2024)  Conclusions: Nonobstructive coronary artery disease  with minimal luminal irregularities in the RCA.  No significant CAD involving the left coronary artery.  Appearance is similar to last catheterization in 07/2022. Moderately to severely reduced left ventricular systolic function with preserved basal and apical function and akinesis of the mid left ventricle consistent with Takotsubo variant (LVEF 30-35%). Mildly elevated left ventricular filling pressure (LVEDP 20 mmHg).   Recommendations: Initiate goal-directed medical therapy; will add low-dose carvedilol  and losartan . Consider gentle diuresis beginning tomorrow as blood pressure and renal function allow. Medical therapy and risk factor modification to prevent progression of mild CAD.   Lonni Hanson, MD [facility]  Patient Profile    Allison Rangel is a 73 y.o. 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  Assessment & Plan  1: Non-STEMI-patient developed chest pain while swallowing a Ice Cube.  Her troponin was elevated at 2000.  Her EKG showed no acute changes.  She was transferred from [facility] for coronary angiography which was performed by Dr. Hanson.  She had essentially clean coronary arteries with Takotsubo syndrome.  She has had this before several years ago 10/23 and her symptoms were similar.  LVEDP was mildly elevated at 20.  2: LV dysfunction-EF in the 30 to 35% range by left ventriculography and 2D echo.  Agree with GDMT with low-dose carvedilol  and losartan .  Suspect LV will normalize within the next 3 months.  Patient stable for discharge.  Exam benign.  Right radial puncture site stable.  Arrange TOC 7.  Patient wishes to follow-up with me in 3 months.    For questions or updates, please contact [facility] Please  consult [email or web address] for contact info under     Signed, Dorn Lesches, MD  03/24/2024, 9:22 AM

## 2024-03-24 NOTE — Discharge Summary (Addendum)
 Discharge Summary   Patient ID: Allison Rangel MRN: [id number]; DOB: 1950-11-26  Admit date: 03/22/2024 Discharge date: 03/24/2024  PCP:  Duanne Butler DASEN, MD   [facility] Providers Cardiologist:  Dorn Lesches, MD       Discharge Diagnoses  Principal Problem:   NSTEMI (non-ST elevated myocardial infarction) [facility]) Active Problems:   Elevated troponin   Takotsubo cardiomyopathy   Hyperlipidemia with target LDL less than 70   Diagnostic Studies/Procedures   LHC 03/23/24: Conclusions: Nonobstructive coronary artery disease with minimal luminal irregularities in the RCA.  No significant CAD involving the left coronary artery.  Appearance is similar to last catheterization in 07/2022. Moderately to severely reduced left ventricular systolic function with preserved basal and apical function and akinesis of the mid left ventricle consistent with Takotsubo variant (LVEF 30-35%). Mildly elevated left ventricular filling pressure (LVEDP 20 mmHg).   Recommendations: Initiate goal-directed medical therapy; will add low-dose carvedilol  and losartan . Consider gentle diuresis beginning tomorrow as blood pressure and renal function allow. Medical therapy and risk factor modification to prevent progression of mild CAD.   Echo 03/23/24: 1. Left ventricular ejection fraction, by estimation, is 30%. The left  ventricle has moderately decreased function. The left ventricle  demonstrates regional wall motion abnormalities (see scoring  diagram/findings for description). Left ventricular  diastolic parameters are consistent with Grade I diastolic dysfunction  (impaired relaxation).   2. Right ventricular systolic function is normal. The right ventricular  size is normal.   3. The mitral valve is normal in structure. No evidence of mitral valve  regurgitation. No evidence of mitral stenosis.   4. The tricuspid valve is abnormal.   5. The aortic valve is tricuspid. Aortic valve  regurgitation is not  visualized. No aortic stenosis is present.   _____________   History of Present Illness    Allison Rangel is a 73 y.o. 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   [name] was last seen in cardiology OV 08/2022 with Dr. Barbaraann for follow-up.  At that time, patient doing well from a cardiac standpoint without any complaints.  Continued on ASA 81 mg daily, Lipitor 20 mg daily and Coreg  3.125 mg twice daily   Patient presented to [facility] ED today with concerns of chest pain.  K decreased 3.3, CR 0.7, TN 1155 >> 2025, WBC decreased 3.5.  CXR with no active disease. EKG showed NSR, HR 73, with very mild ST depression v4/5.  Cardiology fellow from [facility] admitted overnight, ordered cath with plan for transfer to [facility]. ED Treated with heparin  infusion and fentanyl  IV.    On interview, patient reported chest pain started yesterday at 9:30 pm after choking on ice cube, lasting 4-5 hours, described as pressure like someone kicked in my chest, located in mid sternal, does not radiate, severity 10/10, no exacerbated factors, and alleviated by pain medication. Associated with paresthesia down both arms, n/v, diaphoresis. Took 4 baby ASA prior to arrival. Compared to previous episode, CP was very similar but was more sick the last episode. Currently, no chest pain. Patient is very active, doing water aerobics and walking approx 3-4 per week without any anginal concerns.  Denied SOB, orthopnea, PND, palpitations, edema, dizziness, syncope. No recent trauma, fever, or cough. She has not  been taking any medications except vitamins.  She has a history of GERD but has not been taking PPIs due to concern of vitamin depletion side effect.  Reports brief episodes  of acid reflux daily lasting less than minutes. Endorse healthy diet. Family history include CVA (father).   Hospital Course   Consultants: none  NSTEMI Troponin elevated to  She was transferred from [facility] for definitive angiography. LHC showed mild nonobstructive disease, but reduced LVEF. Will focus on risk factor management. Will continue ASA.   Takotsubo cardiomyopathy Echo with LVEF 30% with WMA including akinetic mid anteroseptal segment and hypokinetic apex, basal anteroseptal segment, and mid inferoseptal segment. She was diuresed and started on low dose coreg  3.125 mg BID and 12.5 mg losartan .  Plan to repeat echo in 3 months with follow up appt after.    Hyperlipidemia with LDL goal < 70 03/23/2024: Cholesterol 232; HDL 94; LDL Cholesterol 131; Triglycerides 33; VLDL 7 Will start 20 mg crestor.   Pt seen and examined, deemed stable for discharge. Follow up has been arranged.   Will need BMP in 2 weeks. Echo in 3 months with follow up appt after.      Did the patient have an acute coronary syndrome (MI, NSTEMI, STEMI, etc) this admission?:  No                               Did the patient have a percutaneous coronary intervention (stent / angioplasty)?:  No.        The patient will be scheduled for a TOC follow up appointment in 7-14 days.  A message has been sent to the [facility] and Scheduling Pool at the office where the patient should be seen for follow up.  _____________  Discharge Vitals Blood pressure 93/66, pulse 86, temperature 97.8 F (36.6 C), temperature source Oral, resp. rate 18, height 5' 4 (1.626 m), weight 65.1 kg, SpO2 90%.  Filed Weights   03/23/24 0208 03/23/24 1856 03/24/24 0449  Weight: 68.5 kg 65 kg 65.1 kg    Labs & Radiologic Studies  CBC Recent Labs    03/23/24 0010  WBC 3.5*  HGB 14.3  HCT 41.9  MCV 90.7  PLT 178   Basic Metabolic Panel Recent Labs    93/76/74 0010 03/23/24 0252 03/24/24 0228  NA 132*  --  130*  K 3.3*  --   4.5  CL 97*  --  102  CO2 19*  --  19*  GLUCOSE 169*  --  153*  BUN 15  --  11  CREATININE 0.70  --  0.69  CALCIUM  9.3  --  9.1  MG  --  1.9  --    Liver Function Tests No results for input(s): AST, ALT, ALKPHOS, BILITOT, PROT, ALBUMIN in the last 72 hours. No results for input(s): LIPASE, AMYLASE in the last 72 hours. High Sensitivity Troponin:   Recent Labs  Lab 03/23/24 0010 03/23/24 0201  TROPONINIHS 1,155* 2,025*    No results for input(s): TRNPT in the last 720 hours.  BNP Invalid input(s): POCBNP No results for input(s): PROBNP in the last 72 hours.  No results for input(s): BNP in the last 72 hours.  D-Dimer No results for input(s): DDIMER in the last 72 hours. Hemoglobin A1C No results for input(s): HGBA1C in the last 72 hours. Fasting Lipid Panel Recent Labs    03/23/24 0252  CHOL 232*  HDL 94  LDLCALC 131*  TRIG 33  CHOLHDL 2.5   Lipoprotein (a)  Date/Time Value Ref Range Status  07/13/2022 03:21 AM 38.3 (H) <75.0 nmol/L Final    Comment:    (NOTE) Note:  Values greater than or equal to 75.0 nmol/L may       indicate an independent risk factor for CHD,       but must be evaluated with caution when applied       to non-Caucasian populations due to the       influence of genetic factors on Lp(a) across       ethnicities. Performed At: [facility] [address] [place], [place] [id number] Jennette Shorter MD Ey:[id number]     Thyroid  Function Tests No results for input(s): TSH, T4TOTAL, T3FREE, THYROIDAB in the last 72 hours.  Invalid input(s): FREET3 _____________  CARDIAC CATHETERIZATION Result Date: 03/23/2024 Conclusions: Nonobstructive coronary artery disease with minimal luminal irregularities in the RCA.  No significant CAD involving the left coronary artery.  Appearance is similar to last catheterization in 07/2022. Moderately to severely reduced left ventricular systolic function with preserved  basal and apical function and akinesis of the mid left ventricle consistent with Takotsubo variant (LVEF 30-35%). Mildly elevated left ventricular filling pressure (LVEDP 20 mmHg). Recommendations: Initiate goal-directed medical therapy; will add low-dose carvedilol  and losartan . Consider gentle diuresis beginning tomorrow as blood pressure and renal function allow. Medical therapy and risk factor modification to prevent progression of mild CAD. Lonni Hanson, MD [facility]  ECHOCARDIOGRAM COMPLETE Result Date: 03/23/2024    ECHOCARDIOGRAM REPORT   Patient Name:   Allison Rangel Date of Exam: 03/23/2024 Medical Rec #:  [id number]         Height:       64.0 in Accession #:    [id number]        Weight:       151.0 lb Date of Birth:  Aug 28, 1951         BSA:          1.736 m Patient Age:    73 years          BP:           130/91 mmHg Patient Gender: F                 HR:           91 bpm. Exam Location:  [facility] Procedure: 2D Echo, Color Doppler and Cardiac Doppler (Both Spectral and Color            Flow Doppler were utilized during procedure). Indications:    R07.9* Chest pain, unspecified  History:        Patient has prior history of Echocardiogram examinations, most                 recent 08/06/2022.  Sonographer:    Eva Lash Referring Phys: [id number] SCOTESIA Y DUNLAP IMPRESSIONS  1. Left ventricular ejection fraction, by estimation, is 30%. The left ventricle has moderately decreased function. The left ventricle demonstrates regional wall motion abnormalities (see scoring diagram/findings for description). Left ventricular diastolic parameters are consistent with Grade I diastolic dysfunction (impaired relaxation).  2. Right ventricular systolic function is normal. The right ventricular size is normal.  3. The mitral valve is normal in structure. No evidence of mitral valve regurgitation. No evidence of mitral stenosis.  4. The tricuspid valve is abnormal.  5. The aortic valve is tricuspid. Aortic  valve regurgitation is not  visualized. No aortic stenosis is present. FINDINGS  Left Ventricle: Left ventricular ejection fraction, by estimation, is 30%. The left ventricle has moderately decreased function. The left ventricle demonstrates regional wall motion abnormalities. Definity contrast agent was given IV to delineate the left ventricular endocardial borders. There is no left ventricular hypertrophy. Left ventricular diastolic parameters are consistent with Grade I diastolic dysfunction (impaired relaxation).  LV Wall Scoring: The mid anteroseptal segment is akinetic. The entire apex, basal anteroseptal segment, and mid inferoseptal segment are hypokinetic. Right Ventricle: Indeterminant PASP, IVC poorly visualized. The right ventricular size is normal. Right vetricular wall thickness was not well visualized. Right ventricular systolic function is normal. Left Atrium: Left atrial size was normal in size. Right Atrium: Right atrial size was normal in size. Pericardium: Probably liver cyst. There is no evidence of pericardial effusion. Mitral Valve: The mitral valve is normal in structure. No evidence of mitral valve regurgitation. No evidence of mitral valve stenosis. Tricuspid Valve: The tricuspid valve is abnormal. Tricuspid valve regurgitation is mild . No evidence of tricuspid stenosis. Aortic Valve: The aortic valve is tricuspid. Aortic valve regurgitation is not visualized. No aortic stenosis is present. Aortic valve mean gradient measures 2.0 mmHg. Aortic valve peak gradient measures 3.4 mmHg. Aortic valve area, by VTI measures 3.09 cm. Pulmonic Valve: The pulmonic valve was not well visualized. Pulmonic valve regurgitation is not visualized. No evidence of pulmonic stenosis. Aorta: The aortic root and ascending aorta are structurally normal, with no evidence of dilitation. Venous: The inferior vena cava was not well visualized. IAS/Shunts: The interatrial septum was not well visualized.  LEFT  VENTRICLE PLAX 2D LVIDd:         4.10 cm      Diastology LVIDs:         2.10 cm      LV e' medial:    4.46 cm/s LV PW:         0.70 cm      LV E/e' medial:  11.3 LV IVS:        0.90 cm      LV e' lateral:   5.33 cm/s LVOT diam:     2.30 cm      LV E/e' lateral: 9.5 LV SV:         52 LV SV Index:   30 LVOT Area:     4.15 cm  LV Volumes (MOD) LV vol d, MOD A2C: 109.0 ml LV vol d, MOD A4C: 122.0 ml LV vol s, MOD A2C: 68.3 ml LV vol s, MOD A4C: 76.1 ml LV SV MOD A2C:     40.7 ml LV SV MOD A4C:     122.0 ml LV SV MOD BP:      45.3 ml RIGHT VENTRICLE RV S prime:     14.40 cm/s TAPSE (M-mode): 1.0 cm LEFT ATRIUM           Index LA diam:      3.30 cm 1.90 cm/m LA Vol (A2C): 43.8 ml 25.23 ml/m LA Vol (A4C): 24.2 ml 13.94 ml/m  AORTIC VALVE AV Area (Vmax):    3.24 cm AV Area (Vmean):   2.96 cm AV Area (VTI):     3.09 cm AV Vmax:           92.83 cm/s AV Vmean:          65.833 cm/s AV VTI:            0.168 m AV Peak Grad:  3.4 mmHg AV Mean Grad:      2.0 mmHg LVOT Vmax:         72.40 cm/s LVOT Vmean:        46.900 cm/s LVOT VTI:          0.125 m LVOT/AV VTI ratio: 0.74  AORTA Ao Root diam: 3.80 cm Ao Asc diam:  3.20 cm MITRAL VALVE               TRICUSPID VALVE MV Area (PHT): 4.41 cm    TR Peak grad:   19.4 mmHg MV Decel Time: 172 msec    TR Vmax:        220.00 cm/s MV E velocity: 50.40 cm/s MV A velocity: 63.30 cm/s  SHUNTS MV E/A ratio:  0.80        Systemic VTI:  0.12 m                            Systemic Diam: 2.30 cm Dorn Ross MD Electronically signed by Dorn Ross MD Signature Date/Time: 03/23/2024/1:04:34 PM    Final    DG Chest Port 1 View Result Date: 03/22/2024 CLINICAL DATA:  Chest pain EXAM: PORTABLE CHEST 1 VIEW COMPARISON:  07/12/2022 FINDINGS: Stable elevation of right hemidiaphragm. Lungs are clear. No pneumothorax or pleural effusion. Cardiac size within normal limits. Pulmonary vascularity is normal. No acute bone abnormality IMPRESSION: 1. No active disease. Electronically Signed    By: Dorethia Molt M.D.   On: 03/22/2024 23:23    Disposition Pt is being discharged home today in good condition.  Follow-up Plans & Appointments    Discharge Medications Allergies as of 03/24/2024       Reactions   Bee Venom Hives, Itching   Sulfa Antibiotics Other (See Comments)    Not available   Sulfamethoxazole-trimethoprim Other (See Comments)   Unknown reaction   Ivp Dye [iodinated Contrast Media] Rash   Came home after getting it and was up all night. Hasn't had in 20 to 30 years.        Medication List     TAKE these medications    aspirin  EC 81 MG tablet Take 1 tablet (81 mg total) by mouth daily. Swallow whole. Start taking on: March 25, 2024   carvedilol  3.125 MG tablet Commonly known as: COREG  Take 1 tablet (3.125 mg total) by mouth 2 (two) times daily with a meal.   DAILY VITAMINS FOR WOMEN PO Take 1 tablet by mouth daily.   losartan  25 MG tablet Commonly known as: COZAAR  Take 0.5 tablets (12.5 mg total) by mouth daily. Start taking on: March 25, 2024   Magnesium 200 MG Tabs Take 200 mg by mouth daily.   nitroGLYCERIN  0.4 MG SL tablet Commonly known as: NITROSTAT  Place 1 tablet (0.4 mg total) under the tongue every 5 (five) minutes x 3 doses as needed for chest pain.   OMEGA 3 PO Take 2 capsules by mouth daily at 12 noon.   rosuvastatin 20 MG tablet Commonly known as: CRESTOR Take 1 tablet (20 mg total) by mouth daily.   Xelpros 0.005 % Emul Generic drug: Latanoprost Place 1 drop into both eyes at bedtime.         Outstanding Labs/Studies  BMP, echo  Duration of Discharge Encounter: APP Time: 25 minutes   Signed, Jon Nat Hails, PA 03/24/2024, 10:46 AM   Agree with note by Jon Hails PA-C  Patient Court with non-STEMI.  History  of Takotsubo cardiomyopathy several years ago.  Symptoms similar and left heart cath showed nonobstructive disease with apical ballooning.  Stable today.  Okay for discharge on appropriate GDMT  with close outpatient follow-up.  Dorn DOROTHA Lesches, M.D., FACP, [facility], [facility] [address] [place], [place]  [address]  [phone] 03/24/2024 10:54 AM

## 2024-03-24 NOTE — TOC CM/SW Note (Signed)
 Transition of Care [facility]) - Inpatient Brief Assessment   Patient Details  Name: Allison Rangel MRN: [id number] Date of Birth: [birth year]  Transition of Care [facility]) CM/SW Contact:    Waddell Barnie Rama, RN Phone Number: 03/24/2024, 10:57 AM   Clinical Narrative:  From home with spouse, has PCP and insurance on file, states has no HH services in place at this time, has walker/cane at home.  States family member will transport them home at [employer] and family is support system, states gets medications from [facility] in [place].  Pta self ambulatory.   Transition of Care Asessment: Insurance and Status: Insurance coverage has been reviewed Patient has primary care physician: Yes Home environment has been reviewed: home with spouse Prior level of function:: indep Prior/Current Home Services: Current home services (walker/cane) Social Drivers of Health Review: SDOH reviewed no interventions necessary Readmission risk has been reviewed: Yes Transition of care needs: no transition of care needs at this time

## 2024-03-25 ENCOUNTER — Telehealth (HOSPITAL_COMMUNITY): Payer: Self-pay | Admitting: *Deleted

## 2024-03-25 ENCOUNTER — Encounter: Payer: Self-pay | Admitting: Cardiovascular Disease

## 2024-03-25 ENCOUNTER — Telehealth: Payer: Self-pay | Admitting: Cardiovascular Disease

## 2024-03-25 NOTE — Telephone Encounter (Signed)
 Called pt to discuss recent MI and opportunity for Cardiac Rehab Outpatient Program. LVM. She lives in [place] therefore will place referral for [facility] CRPII.  Aliene Aris BS, ACSM-CEP 03/25/2024 3:30 PM

## 2024-03-25 NOTE — Telephone Encounter (Signed)
 Error

## 2024-03-25 NOTE — Telephone Encounter (Signed)
 Pt is scheduled to see Dr. Court in September (3 mon f/u after echo in hospital). Pt would like to stay with Court, this is switch okay with you?

## 2024-03-26 ENCOUNTER — Telehealth (HOSPITAL_COMMUNITY): Payer: Self-pay | Admitting: *Deleted

## 2024-03-26 ENCOUNTER — Telehealth: Payer: Self-pay | Admitting: Cardiovascular Disease

## 2024-03-26 NOTE — Telephone Encounter (Signed)
 Returned phone call to patient. She states her BP usually is around 120/80. On 03/24/24 she was d/c from [facility] with NSTEMI/Elevated Troponin/Takotsubo Cardiomyopathy. Meds at d/c included Carvedilol  3.125 mg BID and Losartan  12.5 mg every day. She states she normally does not take any BP meds.   On 03/25/2024 she was feeling, not myself and lightheaded/slightly dizzy. So then went to the pharmacy to have her BP check and found it to be 100/65. No information on HR. She is working on getting a BP for home use. No BP info for today.   She would like to know what meds she can stop/decrease. Has PCP [facility] on 04/02/24 and sees [facility], [place] on 04/08/24, and then Dr. Court on 06/29/24.

## 2024-03-26 NOTE — Telephone Encounter (Signed)
 LVM. Advised to keep taking current meds, per Dr. Court. Also to stay very well hydrated. Possibly try to move up her appt with a different APP ([facility], [place] has no sooner availability). But [facility], [place] has something 6/30 and [facility], NP on 7/3.

## 2024-03-26 NOTE — Telephone Encounter (Signed)
 Called pt and discussed MI, restrictions, daily wts, building up walking for exercise, and CRPII. Pt very receptive. She does voice her BP has been low. Encouraged her to take it often and communicate with her MD (which she already has). We also discussed not walking in the heat. Referred to [facility], she is interested in program.  Aliene Aris BS, ACSM-CEP 03/26/2024 10:06 AM

## 2024-03-26 NOTE — Telephone Encounter (Signed)
 Pt c/o medication issue:  1. Name of Medication:  Carvedilol  and Losartan   2. How are you currently taking this medication (dosage and times per day)?   3. Are you having a reaction (difficulty breathing--STAT)?   4. What is your medication issue? Patient wants to know if she can stop taking one of these medicine. She says her blood pressure is running too low. 100/65 was one of the reading. Her machine was broken She could not tell any other readings.

## 2024-03-31 NOTE — Telephone Encounter (Signed)
 Called and spoke with patient who states she is seeing her PCP this Thursday. She does not wish  to move up her cardiology appt.

## 2024-04-02 ENCOUNTER — Ambulatory Visit: Admitting: Family Medicine

## 2024-04-02 ENCOUNTER — Encounter: Payer: Self-pay | Admitting: Family Medicine

## 2024-04-02 VITALS — BP 109/74 | HR 83 | Temp 98.6°F | Ht 64.0 in | Wt 145.4 lb

## 2024-04-02 DIAGNOSIS — E785 Hyperlipidemia, unspecified: Secondary | ICD-10-CM

## 2024-04-02 DIAGNOSIS — R7303 Prediabetes: Secondary | ICD-10-CM

## 2024-04-02 DIAGNOSIS — Z13820 Encounter for screening for osteoporosis: Secondary | ICD-10-CM

## 2024-04-02 DIAGNOSIS — Z7689 Persons encountering health services in other specified circumstances: Secondary | ICD-10-CM | POA: Insufficient documentation

## 2024-04-02 DIAGNOSIS — N941 Unspecified dyspareunia: Secondary | ICD-10-CM | POA: Insufficient documentation

## 2024-04-02 DIAGNOSIS — Z1159 Encounter for screening for other viral diseases: Secondary | ICD-10-CM

## 2024-04-02 DIAGNOSIS — E7800 Pure hypercholesterolemia, unspecified: Secondary | ICD-10-CM | POA: Insufficient documentation

## 2024-04-02 DIAGNOSIS — K219 Gastro-esophageal reflux disease without esophagitis: Secondary | ICD-10-CM

## 2024-04-02 DIAGNOSIS — I5181 Takotsubo syndrome: Secondary | ICD-10-CM

## 2024-04-02 DIAGNOSIS — Z780 Asymptomatic menopausal state: Secondary | ICD-10-CM

## 2024-04-02 DIAGNOSIS — K589 Irritable bowel syndrome without diarrhea: Secondary | ICD-10-CM | POA: Insufficient documentation

## 2024-04-02 NOTE — Assessment & Plan Note (Signed)
 Well controlled with Tums however she does endorse symptoms of choking daily. Will refer to GI for evaluation and possible EGD.

## 2024-04-02 NOTE — Assessment & Plan Note (Signed)
 Continue Crestor  20mg  daily. Follow up labs prior to next OV in 4-6 weeks, lipids and CMP.

## 2024-04-02 NOTE — Patient Instructions (Signed)
 It was great to meet you today and I'm excited to have you join the [facility] practice. I hope you had a positive experience today! If you feel so inclined, please feel free to recommend our practice to friends and family. Kurtis Bushman, FNP-C

## 2024-04-02 NOTE — Assessment & Plan Note (Addendum)
 Followed by Cardiology, next appt 7/8. Discontinued Carvedilol  due to side effects, is taking Losartan  12.5mg  daily and bASA. Plan for ECHO in 3 months.  CMP today for follow up on hospital labs.

## 2024-04-02 NOTE — Progress Notes (Signed)
 New Patient Office Visit  Subjective    Patient ID: Allison Rangel, female    DOB: 10-06-1950  Age: 73 y.o. MRN: [id number]  CC:  Chief Complaint  Patient presents with   Establish Care    Questions about medication carvedilol . States it was given in the hospital. She is unable to take it due to drop in B/P and dizziness.  Review B/P machine readings.     HPI Allison Rangel presents to establish care. Oriented to practice routines and expectations. Has been seeing PCP regularly. PMH includes MI, Takotsubo cardiomyopathy, nonobstructive CAD, HFimpEF, GERD. Allison Rangel had a recent hospitalization for NSTEMI from 6/22-6/24/25.  Has not been previously followed by Cardiology but has upcoming appt for follow up hospitalization on 7/8. Was started on Carvedilol , Losartan , bASA, and Crestor  during hospitalization and is doing cardiac rehab. She became hypotensive, lightheaded and dizzy so she has discontinued Carvedilol , has notified cardiology about this.  Allison Rangel denies subsequent chest pain, SOB, palpitations, swelling of extremities, headache, vision changes since her hospitalization.  GERD: previously well controlled on Omeprazole, currently taking Tums, recurrent choking with vomiting at time of her NSTEMI, reports coughing and choking daily, would like referral to GI  Hospital DC Summary 03/24/2024 for reference only: History of Present Illness     Allison Rangel is a 73 y.o. 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    Allison. Rangel was last seen in cardiology OV 08/2022 with Dr. Barbaraann for follow-up.  At that time, patient doing well from a cardiac standpoint without any complaints.  Continued on ASA 81 mg daily, Lipitor 20 mg daily and Coreg  3.125 mg  twice daily   Patient presented to [facility] today with concerns of chest pain.  K decreased 3.3, CR 0.7, TN 1155 >> 2025, WBC decreased 3.5.  CXR with no active disease. EKG showed NSR, HR 73, with very mild ST depression v4/5.  Cardiology fellow from [facility] admitted overnight, ordered cath with plan for transfer to [facility]. ED Treated with heparin  infusion and fentanyl  IV.    On interview, patient reported chest pain started yesterday at 9:30 pm after choking on ice cube, lasting 4-5 hours, described as pressure like someone kicked in my chest, located in mid sternal, does not radiate, severity 10/10, no exacerbated factors, and alleviated by pain medication. Associated with paresthesia down both arms, n/v, diaphoresis. Took 4 baby ASA prior to arrival. Compared to previous episode, CP was very similar but was more sick the last episode. Currently, no chest pain. Patient is very active, doing water aerobics and walking approx 3-4 per week without any anginal concerns.  Denied SOB, orthopnea, PND, palpitations, edema, dizziness, syncope. No recent trauma, fever, or cough. She has not been taking any medications except vitamins.  She has a history of GERD but has not been taking PPIs due to concern of vitamin depletion side effect.  Reports brief episodes of acid reflux daily lasting less than minutes. Endorse healthy diet. Family history include CVA (father).    Hospital Course   Consultants: none   NSTEMI Troponin elevated to  She was transferred from [facility] for definitive angiography. LHC showed mild nonobstructive disease, but reduced LVEF. Will focus on risk factor management. Will continue ASA.  Takotsubo cardiomyopathy Echo with LVEF 30% with WMA including akinetic mid anteroseptal segment and hypokinetic apex, basal anteroseptal segment, and mid inferoseptal segment. She was diuresed and started on low dose coreg  3.125 mg BID and 12.5 mg losartan .  Plan to repeat echo in 3 months with  follow up appt after.      Hyperlipidemia with LDL goal < 70 03/23/2024: Cholesterol 232; HDL 94; LDL Cholesterol 131; Triglycerides 33; VLDL 7 Will start 20 mg crestor .     Pt seen and examined, deemed stable for discharge. Follow up has been arranged.    Will need BMP in 2 weeks. Echo in 3 months with follow up appt after.  Outpatient Encounter Medications as of 04/02/2024  Medication Sig   aspirin  EC 81 MG tablet Take 1 tablet (81 mg total) by mouth daily. Swallow whole.   Bromelains 100 MG CHEW Chew 165 mg by mouth daily.   Latanoprost (XELPROS) 0.005 % EMUL Place 1 drop into both eyes at bedtime.   losartan  (COZAAR ) 25 MG tablet Take 0.5 tablets (12.5 mg total) by mouth daily.   Magnesium 200 MG TABS Take 200 mg by mouth daily.   Multiple Vitamins-Calcium  (DAILY VITAMINS FOR WOMEN PO) Take 1 tablet by mouth daily.   nitroGLYCERIN  (NITROSTAT ) 0.4 MG SL tablet Place 1 tablet (0.4 mg total) under the tongue every 5 (five) minutes x 3 doses as needed for chest pain.   Omega-3 Fatty Acids (OMEGA 3 PO) Take 2 capsules by mouth daily at 12 noon.   rosuvastatin  (CRESTOR ) 20 MG tablet Take 1 tablet (20 mg total) by mouth daily.   carvedilol  (COREG ) 3.125 MG tablet Take 1 tablet (3.125 mg total) by mouth 2 (two) times daily with a meal. (Patient not taking: Reported on 04/02/2024)   No facility-administered encounter medications on file as of 04/02/2024.    Past Medical History:  Diagnosis Date   Allergy    Hyperlipidemia    Myocardial infarction [facility])     Past Surgical History:  Procedure Laterality Date   LEFT HEART CATH AND CORONARY ANGIOGRAPHY N/A 07/12/2022   Procedure: LEFT HEART CATH AND CORONARY ANGIOGRAPHY;  Surgeon: Dann Candyce RAMAN, MD;  Location: [facility] INVASIVE CV LAB;  Service: Cardiovascular;  Laterality: N/A;   LEFT HEART CATH AND CORONARY ANGIOGRAPHY N/A 03/23/2024   Procedure: LEFT HEART CATH AND CORONARY ANGIOGRAPHY;  Surgeon: Mady Bruckner, MD;  Location: [facility] INVASIVE  CV LAB;  Service: Cardiovascular;  Laterality: N/A;   LUMBAR DISC SURGERY     7-8 years   WISDOM TOOTH EXTRACTION      Family History  Problem Relation Age of Onset   Stroke Father    Colon cancer Neg Hx    Colon polyps Neg Hx    Esophageal cancer Neg Hx    Rectal cancer Neg Hx    Stomach cancer Neg Hx     Social History   Socioeconomic History   Marital status: Married    Spouse name: Not on file   Number of children: Not on file   Years of education: Not on file   Highest education level: Not on file  Occupational History   Not on file  Tobacco Use   Smoking status: Never   Smokeless tobacco: Never  Vaping Use   Vaping status: Never Used  Substance and Sexual Activity   Alcohol use: Yes    Comment: rare   Drug use: Never   Sexual activity: Yes    Birth control/protection: Post-menopausal  Other  Topics Concern   Not on file  Social History Narrative   Not on file   Social Drivers of Health   Financial Resource Strain: Low Risk  (01/17/2023)   Received from [facility]   Overall Financial Resource Strain (CARDIA)    Difficulty of Paying Living Expenses: Not hard at all  Food Insecurity: No Food Insecurity (01/17/2023)   Received from [facility]   Hunger Vital Sign    Within the past 12 months, you worried that your food would run out before you got the money to buy more.: Never true    Within the past 12 months, the food you bought just didn't last and you didn't have money to get more.: Never true  Transportation Needs: No Transportation Needs (01/17/2023)   Received from [facility] - Transportation    Lack of Transportation (Medical): No    Lack of Transportation (Non-Medical): No  Physical Activity: Sufficiently Active (01/17/2023)   Received from [facility]   Exercise Vital Sign    On average, how many days per week do you engage in moderate to strenuous exercise (like a brisk walk)?: 3 days    On average, how many minutes do you  engage in exercise at this level?: 60 min  Stress: No Stress Concern Present (01/17/2023)   Received from [facility] - Occupational Stress Questionnaire    Feeling of Stress : Not at all  Social Connections: Socially Integrated (01/17/2023)   Received from [facility]   Social Network    How would you rate your social network (family, work, friends)?: Good participation with social networks  Intimate Partner Violence: Not At Risk (01/17/2023)   Received from [facility]   HITS    Over the last 12 months how often did your partner physically hurt you?: Never    Over the last 12 months how often did your partner insult you or talk down to you?: Rarely    Over the last 12 months how often did your partner threaten you with physical harm?: Never    Over the last 12 months how often did your partner scream or curse at you?: Never    Review of Systems  All other systems reviewed and are negative.       Objective    BP 109/74 (BP Location: Left Arm, Patient Position: Sitting, Cuff Size: Normal) Comment: home cuff  Pulse 83   Temp 98.6 F (37 C)   Ht 5' 4 (1.626 m)   Wt 145 lb 6.4 oz (66 kg)   SpO2 96%   BMI 24.96 kg/m   Physical Exam Vitals and nursing note reviewed.  Constitutional:      Appearance: Normal appearance. She is normal weight.  HENT:     Head: Normocephalic and atraumatic.  Cardiovascular:     Rate and Rhythm: Normal rate and regular rhythm.     Pulses: Normal pulses.     Heart sounds: Normal heart sounds.  Pulmonary:     Effort: Pulmonary effort is normal.     Breath sounds: Normal breath sounds.  Skin:    General: Skin is warm and dry.  Neurological:     General: No focal deficit present.     Mental Status: She is alert and oriented to person, place, and time. Mental status is at baseline.  Psychiatric:        Mood and Affect: Mood normal.  Behavior: Behavior normal.        Thought Content:  Thought content normal.        Judgment: Judgment normal.         Assessment & Plan:   Problem List Items Addressed This Visit     Takotsubo cardiomyopathy   Followed by Cardiology, next appt 7/8. Discontinued Carvedilol  due to side effects, is taking Losartan  12.5mg  daily and bASA. Plan for ECHO in 3 months.  CMP today for follow up on hospital labs.      Relevant Orders   Comprehensive metabolic panel with GFR   Hemoglobin A1c   Hyperlipidemia with target LDL less than 70   Continue Crestor  20mg  daily. Follow up labs prior to next OV in 4-6 weeks, lipids and CMP.       Gastro-esophageal reflux disease without esophagitis   Well controlled with Tums however she does endorse symptoms of choking daily. Will refer to GI for evaluation and possible EGD.       Relevant Orders   Ambulatory referral to Gastroenterology   Encounter to establish care with new provider - Primary   Today we reviewed your past medical history, health maintenance items, recent hospitalization, and current concerns. We will obtain labs to follow up on your hospitalization, keep  your appts with cardiology. Referral has been placed to GI to evaluate your upper GI symptoms. I have also ordered a routine DEXA scan to evaluate for osteoporosis.       Other Visit Diagnoses       Prediabetes       Relevant Orders   Comprehensive metabolic panel with GFR   Hemoglobin A1c     Need for hepatitis C screening test       Relevant Orders   Hepatitis C antibody     Encounter for osteoporosis screening in asymptomatic postmenopausal patient       Relevant Orders   DG Bone Density       Return in about 1 month (around 05/04/2024) for AWV, chronic follow-up with labs 1 week prior.   Jeoffrey GORMAN Barrio, FNP

## 2024-04-02 NOTE — Assessment & Plan Note (Signed)
 Today we reviewed your past medical history, health maintenance items, recent hospitalization, and current concerns. We will obtain labs to follow up on your hospitalization, keep  your appts with cardiology. Referral has been placed to GI to evaluate your upper GI symptoms. I have also ordered a routine DEXA scan to evaluate for osteoporosis.

## 2024-04-03 LAB — COMPREHENSIVE METABOLIC PANEL WITH GFR
AG Ratio: 2 (calc) (ref 1.0–2.5)
ALT: 19 U/L (ref 6–29)
AST: 14 U/L (ref 10–35)
Albumin: 4.3 g/dL (ref 3.6–5.1)
Alkaline phosphatase (APISO): 57 U/L (ref 37–153)
BUN: 13 mg/dL (ref 7–25)
CO2: 27 mmol/L (ref 20–32)
Calcium: 9.5 mg/dL (ref 8.6–10.4)
Chloride: 101 mmol/L (ref 98–110)
Creat: 0.63 mg/dL (ref 0.60–1.00)
Globulin: 2.1 g/dL (ref 1.9–3.7)
Glucose, Bld: 75 mg/dL (ref 65–99)
Potassium: 4.4 mmol/L (ref 3.5–5.3)
Sodium: 137 mmol/L (ref 135–146)
Total Bilirubin: 0.6 mg/dL (ref 0.2–1.2)
Total Protein: 6.4 g/dL (ref 6.1–8.1)
eGFR: 95 mL/min/1.73m2 (ref >=60)

## 2024-04-03 LAB — HEMOGLOBIN A1C
Hgb A1c MFr Bld: 6.2 % — ABNORMAL HIGH (ref <5.7)
Mean Plasma Glucose: 131 mg/dL
eAG (mmol/L): 7.3 mmol/L

## 2024-04-03 LAB — HEPATITIS C ANTIBODY: Hepatitis C Ab: NONREACTIVE

## 2024-04-05 NOTE — Progress Notes (Unsigned)
 Cardiology Office Note:    Date:  04/08/2024   ID:  Allison Rangel, DOB 02-02-1951, MRN [id number]  PCP:  Kayla Jeoffrey RAMAN, FNP   [facility] Providers Cardiologist:  Dorn Lesches, MD Cardiology APP:  Madie Jon Garre, PA     Referring MD: Duanne Butler DASEN, MD   Chief Complaint  Patient presents with   Follow-up    Stress CM    History of Present Illness:    Allison Rangel is a 73 y.o. female with a hx of Takotsubo cardiomyopathy in 07/2022 and again 03/2024, nonobstructive CAD (cath 07/2022), HFimpEF (07/2022 with EF 35-40% via cath in setting of Takotsubo that improved to 50 to 55% via ECHO in 08/20/2022), and GERD.  She was hospitalized again 03/23/24 with chest pain and elevated CE. Repeat LHC with nonobstructive disease. Echo with LVEF 30% with WMA including akinetic mid anteroseptal segment and hypokinetic apex, basal anterior septal segment, and mid inferoseptal segment.  She was diuresed and started on low-dose Coreg  3.125 mg twice daily and 12.5 mg losartan .   She has had marginal BP and self-discontinued coreg . BP readings from BP monitor 114/75, 122/85, 115/79, 121/78, 102/75, 114/84 - these are readings after morning medications.   She is actually taking 25 mg losartan , not 12.5 mg as listed on med list. BMP stable at follow up.    Past Medical History:  Diagnosis Date   Allergy    Hyperlipidemia    Myocardial infarction [facility])     Past Surgical History:  Procedure Laterality Date   LEFT HEART CATH AND CORONARY ANGIOGRAPHY N/A 07/12/2022   Procedure: LEFT HEART CATH AND CORONARY ANGIOGRAPHY;  Surgeon: Dann Candyce RAMAN, MD;  Location: [facility] INVASIVE CV LAB;  Service: Cardiovascular;  Laterality: N/A;   LEFT HEART CATH AND CORONARY ANGIOGRAPHY N/A 03/23/2024   Procedure: LEFT HEART CATH AND CORONARY ANGIOGRAPHY;  Surgeon: Mady Bruckner, MD;  Location: [facility] INVASIVE CV LAB;  Service: Cardiovascular;  Laterality: N/A;   LUMBAR DISC SURGERY     7-8  years   WISDOM TOOTH EXTRACTION      Current Medications: Current Meds  Medication Sig   aspirin  EC 81 MG tablet Take 1 tablet (81 mg total) by mouth daily. Swallow whole.   Bromelains 100 MG CHEW Chew 165 mg by mouth daily.   Latanoprost (XELPROS) 0.005 % EMUL Place 1 drop into both eyes at bedtime.   losartan  (COZAAR ) 25 MG tablet Take 0.5 tablets (12.5 mg total) by mouth daily. (Patient taking differently: Take 25 mg by mouth daily.)   Magnesium 200 MG TABS Take 200 mg by mouth daily.   metoprolol  succinate (TOPROL  XL) 25 MG 24 hr tablet Take 0.5 tablets (12.5 mg total) by mouth daily.   Multiple Vitamins-Calcium  (DAILY VITAMINS FOR WOMEN PO) Take 1 tablet by mouth daily.   nitroGLYCERIN  (NITROSTAT ) 0.4 MG SL tablet Place 1 tablet (0.4 mg total) under the tongue every 5 (five) minutes x 3 doses as needed for chest pain.   Omega-3 Fatty Acids (OMEGA 3 PO) Take 2 capsules by mouth daily at 12 noon.   rosuvastatin  (CRESTOR ) 20 MG tablet Take 1 tablet (20 mg total) by mouth daily.     Allergies:   Bee venom, Sulfa antibiotics, Sulfamethoxazole-trimethoprim, and Ivp dye [iodinated contrast media]   Social History   Socioeconomic History   Marital status: Married    Spouse name: Not on file   Number of children: Not on file   Years of education: Not  on file   Highest education level: Not on file  Occupational History   Not on file  Tobacco Use   Smoking status: Never   Smokeless tobacco: Never  Vaping Use   Vaping status: Never Used  Substance and Sexual Activity   Alcohol use: Yes    Comment: rare   Drug use: Never   Sexual activity: Yes    Birth control/protection: Post-menopausal  Other Topics Concern   Not on file  Social History Narrative   Not on file   Social Drivers of Health   Financial Resource Strain: Low Risk  (01/17/2023)   Received from [facility]   Overall Financial Resource Strain (CARDIA)    Difficulty of Paying Living Expenses: Not hard at all   Food Insecurity: No Food Insecurity (01/17/2023)   Received from [facility]   Hunger Vital Sign    Within the past 12 months, you worried that your food would run out before you got the money to buy more.: Never true    Within the past 12 months, the food you bought just didn't last and you didn't have money to get more.: Never true  Transportation Needs: No Transportation Needs (01/17/2023)   Received from [facility] - Transportation    Lack of Transportation (Medical): No    Lack of Transportation (Non-Medical): No  Physical Activity: Sufficiently Active (01/17/2023)   Received from [facility]   Exercise Vital Sign    On average, how many days per week do you engage in moderate to strenuous exercise (like a brisk walk)?: 3 days    On average, how many minutes do you engage in exercise at this level?: 60 min  Stress: No Stress Concern Present (01/17/2023)   Received from [facility] - Occupational Stress Questionnaire    Feeling of Stress : Not at all  Social Connections: Socially Integrated (01/17/2023)   Received from [facility]   Social Network    How would you rate your social network (family, work, friends)?: Good participation with social networks     Family History: The patient's family history includes Stroke in her father. There is no history of Colon cancer, Colon polyps, Esophageal cancer, Rectal cancer, or Stomach cancer.  ROS:   Please see the history of present illness.     All other systems reviewed and are negative.  EKGs/Labs/Other Studies Reviewed:    The following studies were reviewed today:       Recent Labs: 03/23/2024: Hemoglobin 14.3; Magnesium 1.9; Platelets 178 04/02/2024: ALT 19; BUN 13; Creat 0.63; Potassium 4.4; Sodium 137  Recent Lipid Panel    Component Value Date/Time   CHOL 232 (H) 03/23/2024 0252   TRIG 33 03/23/2024 0252   HDL 94 03/23/2024 0252   CHOLHDL 2.5 03/23/2024 0252    VLDL 7 03/23/2024 0252   LDLCALC 131 (H) 03/23/2024 0252     Risk Assessment/Calculations:                Physical Exam:    VS:  BP 96/60   Pulse 79   Ht 5' 4 (1.626 m)   Wt 146 lb 3.2 oz (66.3 kg)   SpO2 95%   BMI 25.10 kg/m     Wt Readings from Last 3 Encounters:  04/08/24 146 lb 3.2 oz (66.3 kg)  04/02/24 145 lb 6.4 oz (66 kg)  03/24/24 143 lb 9.6 oz (65.1 kg)     GEN:  Well nourished, well developed in no acute distress HEENT: Normal NECK: No JVD; No carotid bruits LYMPHATICS: No lymphadenopathy CARDIAC: RRR, no murmurs, rubs, gallops RESPIRATORY:  Clear to auscultation without rales, wheezing or rhonchi  ABDOMEN: Soft, non-tender, non-distended MUSCULOSKELETAL:  No edema; No deformity  SKIN: Warm and dry NEUROLOGIC:  Alert and oriented x 3 PSYCHIATRIC:  Normal affect   ASSESSMENT:    1. Nonischemic cardiomyopathy (HCC)   2. NSTEMI (non-ST elevated myocardial infarction) (HCC)   3. Takotsubo cardiomyopathy   4. Hyperlipidemia with target LDL less than 70   5. Prediabetes    PLAN:    In order of problems listed above:  Takotsubo cardiomyopathy - GDMT limited by BP - she is takin 25 mg losartan , self discontinued coreg , will start low dose toprol  for GDMT - Repeat BMP stable by PCP - Repeat echocardiogram in 2 months   Nonobstructive CAD - Risk factor management -Continue aspirin  monotherapy   Hyperlipidemia with LDL goal less than 70 - Continue 20 mg Crestor  and omega-3 fatty acids   Prediabetes - A1c 6.2% - encouraged activity   Echo in 2 months Follow up in 3 months.      Cardiac Rehabilitation Eligibility Assessment  The patient is ready to start cardiac rehabilitation from a cardiac standpoint.          Medication Adjustments/Labs and Tests Ordered: Current medicines are reviewed at length with the patient today.  Concerns regarding medicines are outlined above.  Orders Placed This Encounter  Procedures    ECHOCARDIOGRAM COMPLETE   Meds ordered this encounter  Medications   metoprolol  succinate (TOPROL  XL) 25 MG 24 hr tablet    Sig: Take 0.5 tablets (12.5 mg total) by mouth daily.    Dispense:  45 tablet    Refill:  3    Patient Instructions  Medication Instructions:  Stop Carvedilol  as directed Start Toprol  12.5 mg nightly    *If you need a refill on your cardiac medications before your next appointment, please call your pharmacy*  Lab Work: NONE ordered at this time of appointment    Testing/Procedures: Your physician has requested that you have an echocardiogram in 2 months. Echocardiography is a painless test that uses sound waves to create images of your heart. It provides your doctor with information about the size and shape of your heart and how well your heart's chambers and valves are working. This procedure takes approximately one hour. There are no restrictions for this procedure. Please do NOT wear cologne, perfume, aftershave, or lotions (deodorant is allowed). Please arrive 15 minutes prior to your appointment time.  Please note: We ask at that you not bring children with you during ultrasound (echo/ vascular) testing. Due to room size and safety concerns, children are not allowed in the ultrasound rooms during exams. Our front office staff cannot provide observation of children in our lobby area while testing is being conducted. An adult accompanying a patient to their appointment will only be allowed in the ultrasound room at the discretion of the ultrasound technician under special circumstances. We apologize for any inconvenience.   Follow-Up: At [facility], you and your health needs are our priority.  As part of our continuing mission to provide you with exceptional heart care, our providers are all part of one team.  This team includes your primary Cardiologist (physician) and Advanced Practice Providers or APPs (Physician Assistants and Nurse Practitioners)  who all work together to provide you with the care you need, when  you need it.  Your next appointment:   3 month(s)  Provider:   Dorn Lesches, MD    We recommend signing up for the patient portal called MyChart.  Sign up information is provided on this After Visit Summary.  MyChart is used to connect with patients for Virtual Visits (Telemedicine).  Patients are able to view lab/test results, encounter notes, upcoming appointments, etc.  Non-urgent messages can be sent to your provider as well.   To learn more about what you can do with MyChart, go to [email or web address].   Other Instructions        Signed, Jon Nat Hails, [place]  04/08/2024 3:44 PM    [facility]

## 2024-04-06 ENCOUNTER — Ambulatory Visit: Payer: Self-pay | Admitting: Family Medicine

## 2024-04-08 ENCOUNTER — Encounter: Payer: Self-pay | Admitting: Physician Assistant

## 2024-04-08 ENCOUNTER — Ambulatory Visit: Attending: Cardiovascular Disease | Admitting: Physician Assistant

## 2024-04-08 VITALS — BP 96/60 | HR 79 | Ht 64.0 in | Wt 146.2 lb

## 2024-04-08 DIAGNOSIS — R7303 Prediabetes: Secondary | ICD-10-CM

## 2024-04-08 DIAGNOSIS — I214 Non-ST elevation (NSTEMI) myocardial infarction: Secondary | ICD-10-CM | POA: Diagnosis not present

## 2024-04-08 DIAGNOSIS — I5181 Takotsubo syndrome: Secondary | ICD-10-CM | POA: Diagnosis not present

## 2024-04-08 DIAGNOSIS — I428 Other cardiomyopathies: Secondary | ICD-10-CM

## 2024-04-08 DIAGNOSIS — E785 Hyperlipidemia, unspecified: Secondary | ICD-10-CM | POA: Diagnosis not present

## 2024-04-08 MED ORDER — METOPROLOL SUCCINATE ER 25 MG PO TB24: 12.5000 mg | ORAL_TABLET | Freq: Every day | ORAL | 3 refills | Status: DC

## 2024-04-08 NOTE — Patient Instructions (Signed)
 Medication Instructions:  Stop Carvedilol  as directed Start Toprol  12.5 mg nightly    *If you need a refill on your cardiac medications before your next appointment, please call your pharmacy*  Lab Work: NONE ordered at this time of appointment    Testing/Procedures: Your physician has requested that you have an echocardiogram in 2 months. Echocardiography is a painless test that uses sound waves to create images of your heart. It provides your doctor with information about the size and shape of your heart and how well your heart's chambers and valves are working. This procedure takes approximately one hour. There are no restrictions for this procedure. Please do NOT wear cologne, perfume, aftershave, or lotions (deodorant is allowed). Please arrive 15 minutes prior to your appointment time.  Please note: We ask at that you not bring children with you during ultrasound (echo/ vascular) testing. Due to room size and safety concerns, children are not allowed in the ultrasound rooms during exams. Our front office staff cannot provide observation of children in our lobby area while testing is being conducted. An adult accompanying a patient to their appointment will only be allowed in the ultrasound room at the discretion of the ultrasound technician under special circumstances. We apologize for any inconvenience.   Follow-Up: At [facility], you and your health needs are our priority.  As part of our continuing mission to provide you with exceptional heart care, our providers are all part of one team.  This team includes your primary Cardiologist (physician) and Advanced Practice Providers or APPs (Physician Assistants and Nurse Practitioners) who all work together to provide you with the care you need, when you need it.  Your next appointment:   3 month(s)  Provider:   Dorn Lesches, MD    We recommend signing up for the patient portal called MyChart.  Sign up information is  provided on this After Visit Summary.  MyChart is used to connect with patients for Virtual Visits (Telemedicine).  Patients are able to view lab/test results, encounter notes, upcoming appointments, etc.  Non-urgent messages can be sent to your provider as well.   To learn more about what you can do with MyChart, go to [email or web address].   Other Instructions

## 2024-04-15 ENCOUNTER — Telehealth: Payer: Self-pay

## 2024-04-15 NOTE — Telephone Encounter (Signed)
 Pt came into office to drop off [employer] ppw to be completed by provider. Pt completed an intake form for ppw. Intake form and [employer] insurance ppw placed in nurse's folder. Pt advised that she would be called once ppw is completed and ready to be picked up.   Cb#: [phone]

## 2024-04-16 ENCOUNTER — Telehealth: Payer: Self-pay | Admitting: *Deleted

## 2024-04-16 ENCOUNTER — Encounter: Payer: Self-pay | Admitting: Physician Assistant

## 2024-04-16 ENCOUNTER — Ambulatory Visit: Admitting: Physician Assistant

## 2024-04-16 VITALS — BP 120/68 | HR 69 | Ht 64.0 in | Wt 146.0 lb

## 2024-04-16 DIAGNOSIS — K219 Gastro-esophageal reflux disease without esophagitis: Secondary | ICD-10-CM | POA: Diagnosis not present

## 2024-04-16 DIAGNOSIS — I5181 Takotsubo syndrome: Secondary | ICD-10-CM | POA: Diagnosis not present

## 2024-04-16 DIAGNOSIS — R931 Abnormal findings on diagnostic imaging of heart and coronary circulation: Secondary | ICD-10-CM

## 2024-04-16 DIAGNOSIS — R1310 Dysphagia, unspecified: Secondary | ICD-10-CM

## 2024-04-16 NOTE — Patient Instructions (Signed)
 Try chewing up 4 Altoids if having choking episode.   Will call back to schedule upper endoscopy once we receive cardiac clearance.   _______________________________________________________  If your blood pressure at your visit was 140/90 or greater, please contact your primary care physician to follow up on this.  _______________________________________________________  If you are age 71 or older, your body mass index should be between 23-30. Your Body mass index is 25.06 kg/m. If this is out of the aforementioned range listed, please consider follow up with your Primary Care Provider.  If you are age 60 or younger, your body mass index should be between 19-25. Your Body mass index is 25.06 kg/m. If this is out of the aformentioned range listed, please consider follow up with your Primary Care Provider.   ________________________________________________________  The [facility] providers would like to encourage you to use MYCHART to communicate with providers for non-urgent requests or questions.  Due to long hold times on the telephone, sending your provider a message by [facility] may be a faster and more efficient way to get a response.  Please allow 48 business hours for a response.  Please remember that this is for non-urgent requests.  _______________________________________________________

## 2024-04-16 NOTE — Telephone Encounter (Signed)
 [facility] Pre-operative Risk Assessment     Request for surgical clearance:     Endoscopy Procedure  What type of surgery is being performed?     Upper endoscopy  When is this surgery scheduled?     TBD  What type of clearance is required ?  Cardiac   Practice name and name of physician performing surgery?      [facility]  What is your office phone and fax number?      Phone- [phone]  Fax- [phone]  Anesthesia type (None, local, MAC, general) ?       MAC   Please route your response to Powell Misty, CMA

## 2024-04-16 NOTE — Progress Notes (Signed)
 Chief Complaint: GERD and dysphagia  HPI:     Allison Rangel is a 73 year old Caucasian female known to Dr. Legrand, with a past medical history of Takotsubo, MI, the last a month ago, who was referred to me by Kayla Jeoffrey RAMAN, FNP for a complaint of GERD and dysphagia.      01/24/15 esophagram showed a small nonreducible hiatal hernia with a distal esophageal ring however the 13 mm barium tablet passed through the ring without difficulty.  No evidence of reflux or esophagitis.     03/15/2023 colonoscopy for screening with internal hemorrhoids and otherwise normal.  Repeat recommended 10 years.    03/23/2024 echo done for chest pain with LVEF by estimation 30%, left ventricle had moderately decreased function and grade 1 diastolic dysfunction.    03/23/2024 left heart cath showed nonobstructive coronary artery disease.  Moderately to severe reduced LVEF and akinesis of the mid left ventricle consistent with Takotsubo variant (LVEF 30-35%).    03/20/2024 BMP with a sodium of 130.  This appears her normal.    04/02/2024 CMP normal.    Today, the patient presents to clinic and tells me that both episodes of her Takotsubo MIs have been from a dysphagia episode/getting choked.  Most recently this was about a month ago.  Patient tells me the last time this happened it was on the tiny piece of ice no bigger than the size of her pinky finger fingernail.  She coughed for 10 minutes and then had an MRI.  Prior to that it was 2 years ago and a piece of toast.  Tells me that she no longer has any trouble swallowing and in fact never notices this until she has a severe episode.  She has a small amount of reflux, she describes 5 minutes every day.  Apparently was on Omeprazole for years but stopped this a few months ago because she had read that this could have bad side effects.  Currently she is using a mixture of apple cider vinegar, Keefer, Swiss crib and a digestible enzyme occasionally and feels like this works well for  her.  She also will chew a ginger candy if she feels like she is choking and that typically helps.  She wants to know what to go do going forward.    Denies fever, chills or weight loss.     Past Medical History:  Diagnosis Date   Allergy    Hyperlipidemia    Myocardial infarction [facility])     Past Surgical History:  Procedure Laterality Date   LEFT HEART CATH AND CORONARY ANGIOGRAPHY N/A 07/12/2022   Procedure: LEFT HEART CATH AND CORONARY ANGIOGRAPHY;  Surgeon: Dann Candyce RAMAN, MD;  Location: [facility] INVASIVE CV LAB;  Service: Cardiovascular;  Laterality: N/A;   LEFT HEART CATH AND CORONARY ANGIOGRAPHY N/A 03/23/2024   Procedure: LEFT HEART CATH AND CORONARY ANGIOGRAPHY;  Surgeon: Mady Bruckner, MD;  Location: [facility] INVASIVE CV LAB;  Service: Cardiovascular;  Laterality: N/A;   LUMBAR DISC SURGERY     7-8 years   WISDOM TOOTH EXTRACTION      Current Outpatient Medications  Medication Sig Dispense Refill   aspirin  EC 81 MG tablet Take 1 tablet (81 mg total) by mouth daily. Swallow whole. 90 tablet 3   Bromelains 100 MG CHEW Chew 165 mg by mouth daily.     Latanoprost (XELPROS) 0.005 % EMUL Place 1 drop into both eyes at bedtime.     losartan  (COZAAR ) 25 MG tablet Take 0.5 tablets (  12.5 mg total) by mouth daily. (Patient taking differently: Take 25 mg by mouth daily.) 90 tablet 3   Magnesium 200 MG TABS Take 200 mg by mouth daily.     metoprolol  succinate (TOPROL  XL) 25 MG 24 hr tablet Take 0.5 tablets (12.5 mg total) by mouth daily. 45 tablet 3   Multiple Vitamins-Calcium  (DAILY VITAMINS FOR WOMEN PO) Take 1 tablet by mouth daily.     nitroGLYCERIN  (NITROSTAT ) 0.4 MG SL tablet Place 1 tablet (0.4 mg total) under the tongue every 5 (five) minutes x 3 doses as needed for chest pain. 25 tablet 3   Omega-3 Fatty Acids (OMEGA 3 PO) Take 2 capsules by mouth daily at 12 noon.     rosuvastatin  (CRESTOR ) 20 MG tablet Take 1 tablet (20 mg total) by mouth daily. 90 tablet 3   No current  facility-administered medications for this visit.    Allergies as of 04/16/2024 - Review Complete 04/16/2024  Allergen Reaction Noted   Bee venom Hives and Itching 07/12/2022   Sulfa antibiotics Other (See Comments) 07/12/2022   Sulfamethoxazole-trimethoprim Other (See Comments) 05/13/2014   Ivp dye [iodinated contrast media] Rash 07/12/2022    Family History  Problem Relation Age of Onset   Stroke Father    Colon cancer Neg Hx    Liver disease Neg Hx    Esophageal cancer Neg Hx     Social History   Socioeconomic History   Marital status: Married    Spouse name: Not on file   Number of children: 3   Years of education: Not on file   Highest education level: Not on file  Occupational History   Occupation: retired  Tobacco Use   Smoking status: Never   Smokeless tobacco: Never  Vaping Use   Vaping status: Never Used  Substance and Sexual Activity   Alcohol use: Yes    Comment: rare   Drug use: Never   Sexual activity: Yes    Birth control/protection: Post-menopausal  Other Topics Concern   Not on file  Social History Narrative   Not on file   Social Drivers of Health   Financial Resource Strain: Low Risk  (01/17/2023)   Received from [facility]   Overall Financial Resource Strain (CARDIA)    Difficulty of Paying Living Expenses: Not hard at all  Food Insecurity: No Food Insecurity (01/17/2023)   [facility]   Hunger Vital Sign    Within the past 12 months, you worried that your food would run out before you got the money to buy more.: Never true    Within the past 12 months, the food you bought just didn't last and you didn't have money to get more.: Never true  Transportation Needs: No Transportation Needs (01/17/2023)   Received from [facility] - Transportation    Lack of Transportation (Medical): No    Lack of Transportation (Non-Medical): No  Physical Activity: Sufficiently Active (01/17/2023)   Received from [facility]    Exercise Vital Sign    On average, how many days per week do you engage in moderate to strenuous exercise (like a brisk walk)?: 3 days    On average, how many minutes do you engage in exercise at this level?: 60 min  Stress: No Stress Concern Present (01/17/2023)   Received from [facility] - Occupational Stress Questionnaire    Feeling of Stress : Not at all  Social Connections: Socially Integrated (  01/17/2023)   Received from [facility]   Social Network    How would you rate your social network (family, work, friends)?: Good participation with social networks  Intimate Partner Violence: Not At Risk (01/17/2023)   Received from [facility]   HITS    Over the last 12 months how often did your partner physically hurt you?: Never    Over the last 12 months how often did your partner insult you or talk down to you?: Rarely    Over the last 12 months how often did your partner threaten you with physical harm?: Never    Over the last 12 months how often did your partner scream or curse at you?: Never    Review of Systems:    Constitutional: No weight loss, fever or chills Skin: No rash Cardiovascular: No chest pain   Respiratory: No SOB or cough Gastrointestinal: See HPI and otherwise negative Genitourinary: No dysuria Neurological: No headache, dizziness or syncope Musculoskeletal: No new muscle or joint pain Hematologic: No bleeding  Psychiatric: No history of depression or anxiety   Physical Exam:  Vital signs: BP 120/68   Pulse 69   Ht 5' 4 (1.626 m)   Wt 146 lb (66.2 kg)   BMI 25.06 kg/m   Constitutional:   Pleasant elderly  Caucasian female appears to be in NAD, Well developed, Well nourished, alert and cooperative Head:  Normocephalic and atraumatic. Eyes:   PEERL, EOMI. No icterus. Conjunctiva pink. Ears:  Normal auditory acuity. Neck:  Supple Throat: Oral cavity and pharynx without inflammation, swelling or lesion.   Respiratory: Respirations even and unlabored. Lungs clear to auscultation bilaterally.   No wheezes, crackles, or rhonchi.  Cardiovascular: Normal S1, S2. No MRG. Regular rate and rhythm. No peripheral edema, cyanosis or pallor.  Gastrointestinal:  Soft, nondistended, nontender. No rebound or guarding. Normal bowel sounds. No appreciable masses or hepatomegaly. Rectal:  Not performed.  Msk:  Symmetrical without gross deformities. Without edema, no deformity or joint abnormality.  Neurologic:  Alert and  oriented x4;  grossly normal neurologically.  Skin:   Dry and intact without significant lesions or rashes. Psychiatric: Demonstrates good judgement and reason without abnormal affect or behaviors.  RELEVANT LABS AND IMAGING: CBC    Component Value Date/Time   WBC 3.5 (L) 03/23/2024 0010   RBC 4.62 03/23/2024 0010   HGB 14.3 03/23/2024 0010   HCT 41.9 03/23/2024 0010   PLT 178 03/23/2024 0010   MCV 90.7 03/23/2024 0010   MCH 31.0 03/23/2024 0010   MCHC 34.1 03/23/2024 0010   RDW 13.2 03/23/2024 0010    CMP     Component Value Date/Time   NA 137 04/02/2024 1114   K 4.4 04/02/2024 1114   CL 101 04/02/2024 1114   CO2 27 04/02/2024 1114   GLUCOSE 75 04/02/2024 1114   BUN 13 04/02/2024 1114   CREATININE 0.63 04/02/2024 1114   CALCIUM  9.5 04/02/2024 1114   PROT 6.4 04/02/2024 1114   ALBUMIN 4.7 07/12/2022 0004   AST 14 04/02/2024 1114   ALT 19 04/02/2024 1114   ALKPHOS 73 07/12/2022 0004   BILITOT 0.6 04/02/2024 1114   GFRNONAA >60 03/24/2024 0228   GFRAA >60 05/03/2011 0426    Assessment: 1.  GERD: Daily symptoms that last for 5 minutes at a time, currently managing with holistic remedies including peppermint oil, Swiss Cribb and apple cider vinegar, seems to be doing fairly well on this, previously on Omeprazole but she would prefer not to  be on this medication long-term if not needed; likely chronic gastritis and GERD 2.  Dysphagia: Only with episodes as below; consider  more of an esophageal spasm picture versus true stricture, though she did have a swallow study back in 2016 with an esophageal ring which may have worsened, though would suspect she would feel this more often 3.  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  Discussed with patient that we will need to get cardiac clearance for her.  Her procedure will need to be done at the hospital given decreased ejection fraction and I am assuming that her cardiologist would like us  to wait for a few months prior to putting her to sleep after recent episode.  We will wait to hear back from them prior to scheduling her at the hospital with Dr. Legrand. 2.  For now continue holistic remedies as they seem to be working well for her.  She really does not want to be on a PPI.  Explained that pending results from EGD she may be able to continue this therapy versus be recommended to go back on a PPI. 3.  Reviewed antireflux diet and lifestyle modifications. 4.  Discussed about the possibility of esophageal spasm.  Discussed getting some red-tinned peppermint Altoids and keeping them with her, if she has an episode of dysphagia she could try chewing up four of these and swallowing them to see if it helps. 5.  Patient to follow in clinic per recommendations after we receive cardiac clearance/after EGD.  Delon Failing, PA-C [facility] 04/16/2024, 3:05 PM  Cc: Kayla Jeoffrey RAMAN, FNP

## 2024-04-19 NOTE — Progress Notes (Signed)
 ____________________________________________________________  Attending physician addendum:  Thank you for sending this case to me. I have reviewed the entire note and agree with the plan.  Unusual case, and while an upper endoscopy makes sense, it would be ideal to wait on that until improvement in LVEF if possible. That is, unless she has a severe stricture, which seems doubtful given the fact she is only had 2 episodes dysphagia and the most recent one occurred on a tiny piece of ice.  I agree with your assessment this sounds much more like esophageal spasm and mechanical cause of dysphagia.  That said, those will be unpredictable.  Calcium  channel blockers such as diltiazem perhaps useful in that circumstance, though not likely the right class of medicine for her decreased LVEF (versus the beta-blocker and ARB she is on now).  I copied this to her cardiologist  Please arrange a barium esophagram  Victory Brand, MD  ____________________________________________________________

## 2024-04-23 ENCOUNTER — Other Ambulatory Visit: Payer: Self-pay | Admitting: *Deleted

## 2024-04-23 DIAGNOSIS — R1310 Dysphagia, unspecified: Secondary | ICD-10-CM

## 2024-04-23 NOTE — Progress Notes (Signed)
 Patient informed of below. Patient agreed with plan.

## 2024-04-23 NOTE — Telephone Encounter (Addendum)
   Patient Name: Allison Rangel  DOB: October 16, 1950 MRN: [id number]  Primary Cardiologist: Dorn Lesches, MD  Chart reviewed as part of pre-operative protocol coverage. Given past medical history and time since last visit, based on ACC/AHA guidelines, Allison Rangel is at acceptable risk for the planned procedure without further cardiovascular testing.   Per [place], PA who saw patient in clinic on 04/08/2024: She has a history of stress induced cardiomyopathy with recent heart catheterization with no obstructive disease. She does not require a standing/scheduled diuretic. It may be possible that takotsubo episode was precipitated by a choking episode. She may proceed with endoscopy at acceptable risk without further testing. If endoscopy occurs prior to repeat echo in Sept, would caution excess fluid during the case.  She may hold aspirin  for 5-7 days prior to procedure. Please resume aspirin  as soon as possible postprocedure, at the discretion of the surgeon.    I will route this recommendation to the requesting party via Epic fax function and remove from pre-op pool.  Please call with questions.  Lum LITTIE Louis, NP 04/23/2024, 11:17 AM

## 2024-04-23 NOTE — Telephone Encounter (Signed)
 She has a history of stress induced cardiomyopathy with recent heart catheterization with no obstructive disease. She does not require a standing/scheduled diuretic. It may be possible that takotsubo episode was precipitated by a choking episode. She may proceed with endoscopy at acceptable risk without further testing. If endoscopy occurs prior to repeat echo in Sept, would caution excess fluid during the case.

## 2024-04-23 NOTE — Progress Notes (Signed)
 Left message for patient to call office. Barium esophagram scheduled for 05/05/24 @ 11 am.

## 2024-04-30 NOTE — Telephone Encounter (Signed)
 Paperwork completed and signed by provider. Spoke with patient to advise paperwork will be in drawer at front desk. Patient stated she will be here in the morning to pick it up.   No charge.   Successfully faxed to [facility] Admin with a confirmation time stamp of

## 2024-05-05 ENCOUNTER — Ambulatory Visit: Payer: Self-pay | Admitting: Physician Assistant

## 2024-05-05 ENCOUNTER — Ambulatory Visit (HOSPITAL_COMMUNITY)
Admission: RE | Admit: 2024-05-05 | Discharge: 2024-05-05 | Disposition: A | Discharge status: Home / Self Care | Source: Ambulatory Visit | Attending: Physician Assistant | Admitting: Physician Assistant

## 2024-05-05 DIAGNOSIS — R1310 Dysphagia, unspecified: Secondary | ICD-10-CM | POA: Insufficient documentation

## 2024-05-07 ENCOUNTER — Other Ambulatory Visit

## 2024-05-07 DIAGNOSIS — I5181 Takotsubo syndrome: Secondary | ICD-10-CM

## 2024-05-07 DIAGNOSIS — E785 Hyperlipidemia, unspecified: Secondary | ICD-10-CM

## 2024-05-07 LAB — LIPID PANEL
Cholesterol: 177 mg/dL (ref <200)
HDL: 94 mg/dL (ref >=50)
LDL Cholesterol (Calc): 69 mg/dL
Non-HDL Cholesterol (Calc): 83 mg/dL (ref <130)
Total CHOL/HDL Ratio: 1.9 (calc) (ref <5.0)
Triglycerides: 48 mg/dL (ref <150)

## 2024-05-07 LAB — COMPLETE METABOLIC PANEL WITHOUT GFR
AG Ratio: 2 (calc) (ref 1.0–2.5)
ALT: 17 U/L (ref 6–29)
AST: 11 U/L (ref 10–35)
Albumin: 4.4 g/dL (ref 3.6–5.1)
Alkaline phosphatase (APISO): 57 U/L (ref 37–153)
BUN: 15 mg/dL (ref 7–25)
CO2: 28 mmol/L (ref 20–32)
Calcium: 9.5 mg/dL (ref 8.6–10.4)
Chloride: 103 mmol/L (ref 98–110)
Creat: 0.75 mg/dL (ref 0.60–1.00)
Globulin: 2.2 g/dL (ref 1.9–3.7)
Glucose, Bld: 96 mg/dL (ref 65–99)
Potassium: 4.2 mmol/L (ref 3.5–5.3)
Sodium: 138 mmol/L (ref 135–146)
Total Bilirubin: 0.8 mg/dL (ref 0.2–1.2)
Total Protein: 6.6 g/dL (ref 6.1–8.1)

## 2024-05-08 ENCOUNTER — Other Ambulatory Visit

## 2024-05-11 NOTE — Telephone Encounter (Signed)
 Patient returned phone call. Patient has been advised of appointment. Please advise, thank you

## 2024-05-20 ENCOUNTER — Ambulatory Visit (INDEPENDENT_AMBULATORY_CARE_PROVIDER_SITE_OTHER)

## 2024-05-20 VITALS — Ht 64.0 in | Wt 146.0 lb

## 2024-05-20 DIAGNOSIS — Z0000 Encounter for general adult medical examination without abnormal findings: Secondary | ICD-10-CM | POA: Diagnosis not present

## 2024-05-20 NOTE — Patient Instructions (Signed)
 [name] , Thank you for taking time out of your busy schedule to complete your Annual Wellness Visit with me. I enjoyed our conversation and look forward to speaking with you again next year. I, as well as your care team,  appreciate your ongoing commitment to your health goals. Please review the following plan we discussed and let me know if I can assist you in the future. Your Game plan/ To Do List    Referrals: You have an order for:  []   2D Mammogram  []   3D Mammogram  [x]   Bone Density     Please call for appointment:   [facility] [address] [place], [place] [address] [phone]    Make sure to wear two-piece clothing.  No lotions, powders, or deodorants the day of the appointment. Make sure to bring picture ID and insurance card.  Bring list of medications you are currently taking including any supplements.    Follow up Visits: We will see or speak with you next year for your Next Medicare AWV with our clinical staff Have you seen your provider in the last 6 months (3 months if uncontrolled diabetes)? Yes  Clinician Recommendations:  Aim for 30 minutes of exercise or brisk walking, 6-8 glasses of water, and 5 servings of fruits and vegetables each day.       This is a list of the screenings recommended for you:  Health Maintenance  Topic Date Due   DEXA scan (bone density measurement)  Never done   COVID-19 Vaccine (1 - 2024-25 season) Never done   Flu Shot  05/01/2024   Zoster (Shingles) Vaccine (1 of 2) 07/03/2024*   Pneumococcal Vaccine for age over 47 (2 of 2 - PCV) 04/02/2025*   Mammogram  10/01/2024   DTaP/Tdap/Td vaccine (2 - Td or Tdap) 01/18/2025   Medicare Annual Wellness Visit  05/20/2025   Colon Cancer Screening  03/14/2033   Hepatitis C Screening  Completed   HPV Vaccine  Aged Out   Meningitis B Vaccine  Aged Out  *Topic was postponed. The date shown is not the original due date.    Advanced directives: (In Chart) A  copy of your advanced directives are scanned into your chart should your provider ever need it.  Advance Care Planning is important because it:  [x]  Makes sure you receive the medical care that is consistent with your values, goals, and preferences  [x]  It provides guidance to your family and loved ones and reduces their decisional burden about whether or not they are making the right decisions based on your wishes.  Follow the link provided in your after visit summary or read over the paperwork we have mailed to you to help you started getting your Advance Directives in place. If you need assistance in completing these, please reach out to us  so that we can help you!  See attachments for Preventive Care and Fall Prevention Tips.

## 2024-05-20 NOTE — Progress Notes (Signed)
 Subjective:   Allison Rangel is a 73 y.o. who presents for a Medicare Wellness preventive visit.  As a reminder, Annual Wellness Visits don't include a physical exam, and some assessments may be limited, especially if this visit is performed virtually. We may recommend an in-person follow-up visit with your provider if needed.  Visit Complete: Virtual I connected with  Allison Rangel on 05/20/24 by a video and audio enabled telemedicine application and verified that I am speaking with the correct person using two identifiers.  Patient Location: Home  Provider Location: Home Office  I discussed the limitations of evaluation and management by telemedicine. The patient expressed understanding and agreed to proceed.  Vital Signs: Because this visit was a virtual/telehealth visit, some criteria may be missing or patient reported. Any vitals not documented were not able to be obtained and vitals that have been documented are patient reported.  Persons Participating in Visit: Patient.  AWV Questionnaire: Yes: Patient Medicare AWV questionnaire was completed by the patient on 05/19/24; I have confirmed that all information answered by patient is correct and no changes since this date.  Cardiac Risk Factors include: advanced age (>72men, >35 women);dyslipidemia     Objective:    Today's Vitals   05/20/24 0906  Weight: 146 lb (66.2 kg)  Height: 5' 4 (1.626 m)   Body mass index is 25.06 kg/m.     05/20/2024    9:11 AM 03/22/2024   11:05 PM 07/11/2022   11:59 PM  Advanced Directives  Does Patient Have a Medical Advance Directive? Yes No No  Type of Estate agent of [place];Living will    Does patient want to make changes to medical advance directive? No - Patient declined    Copy of Healthcare Power of Attorney in Chart? Yes - validated most recent copy scanned in chart (See row information)    Would patient like information on creating a medical advance  directive?   No - Patient declined    Current Medications (verified) Outpatient Encounter Medications as of 05/20/2024  Medication Sig   aspirin  EC 81 MG tablet Take 1 tablet (81 mg total) by mouth daily. Swallow whole.   Bromelains 100 MG CHEW Chew 165 mg by mouth daily.   Magnesium 200 MG TABS Take 200 mg by mouth daily.   Multiple Vitamins-Calcium  (DAILY VITAMINS FOR WOMEN PO) Take 1 tablet by mouth daily.   nitroGLYCERIN  (NITROSTAT ) 0.4 MG SL tablet Place 1 tablet (0.4 mg total) under the tongue every 5 (five) minutes x 3 doses as needed for chest pain.   Nutritional Supplements (OSTEO-MULTIVITAMINS/MINERALS PO) Take 1 tablet by mouth daily. Osteo-Matrix   Omega-3 Fatty Acids (OMEGA 3 PO) Take 2 capsules by mouth daily at 12 noon.   omeprazole (PRILOSEC OTC) 20 MG tablet Take 20 mg by mouth daily.   rosuvastatin  (CRESTOR ) 20 MG tablet Take 1 tablet (20 mg total) by mouth daily.   Travoprost, BAK Free, (TRAVATAN) 0.004 % SOLN ophthalmic solution Place 1 drop into both eyes at bedtime.   Latanoprost (XELPROS) 0.005 % EMUL Place 1 drop into both eyes at bedtime.   losartan  (COZAAR ) 25 MG tablet Take 0.5 tablets (12.5 mg total) by mouth daily. (Patient taking differently: Take 25 mg by mouth daily.)   metoprolol  succinate (TOPROL  XL) 25 MG 24 hr tablet Take 0.5 tablets (12.5 mg total) by mouth daily.   No facility-administered encounter medications on file as of 05/20/2024.    Allergies (verified) Bee venom, Sulfa  antibiotics, Sulfamethoxazole-trimethoprim, and Ivp dye [iodinated contrast media]   History: Past Medical History:  Diagnosis Date   Allergy    GERD (gastroesophageal reflux disease)    Hyperlipidemia    Myocardial infarction [facility])    Past Surgical History:  Procedure Laterality Date   LEFT HEART CATH AND CORONARY ANGIOGRAPHY N/A 07/12/2022   Procedure: LEFT HEART CATH AND CORONARY ANGIOGRAPHY;  Surgeon: Dann Candyce RAMAN, MD;  Location: [facility] INVASIVE CV LAB;  Service:  Cardiovascular;  Laterality: N/A;   LEFT HEART CATH AND CORONARY ANGIOGRAPHY N/A 03/23/2024   Procedure: LEFT HEART CATH AND CORONARY ANGIOGRAPHY;  Surgeon: Mady Bruckner, MD;  Location: [facility] INVASIVE CV LAB;  Service: Cardiovascular;  Laterality: N/A;   LUMBAR DISC SURGERY     7-8 years   WISDOM TOOTH EXTRACTION     Family History  Problem Relation Age of Onset   Stroke Father    Colon cancer Neg Hx    Liver disease Neg Hx    Esophageal cancer Neg Hx    Social History   Socioeconomic History   Marital status: Married    Spouse name: Not on file   Number of children: 3   Years of education: Not on file   Highest education level: Not on file  Occupational History   Occupation: retired  Tobacco Use   Smoking status: Never   Smokeless tobacco: Never  Vaping Use   Vaping status: Never Used  Substance and Sexual Activity   Alcohol use: Yes    Comment: rare   Drug use: Never   Sexual activity: Yes    Birth control/protection: Post-menopausal  Other Topics Concern   Not on file  Social History Narrative   Not on file   Social Drivers of Health   Financial Resource Strain: Low Risk  (05/19/2024)   Overall Financial Resource Strain (CARDIA)    Difficulty of Paying Living Expenses: Not hard at all  Food Insecurity: No Food Insecurity (05/19/2024)   Hunger Vital Sign    Worried About Running Out of Food in the Last Year: Never true    Ran Out of Food in the Last Year: Never true  Transportation Needs: No Transportation Needs (05/19/2024)   PRAPARE - Administrator, Civil Service (Medical): No    Lack of Transportation (Non-Medical): No  Physical Activity: Sufficiently Active (05/19/2024)   Exercise Vital Sign    Days of Exercise per Week: 5 days    Minutes of Exercise per Session: 60 min  Stress: No Stress Concern Present (05/19/2024)   [name] of Occupational Health - Occupational Stress Questionnaire    Feeling of Stress: Only a little  Social  Connections: Socially Integrated (05/19/2024)   Social Connection and Isolation Panel    Frequency of Communication with Friends and Family: More than three times a week    Frequency of Social Gatherings with Friends and Family: More than three times a week    Attends Religious Services: More than 4 times per year    Active Member of [employer] or Organizations: Yes    Attends Engineer, structural: More than 4 times per year    Marital Status: Married    Tobacco Counseling Counseling given: Not Answered    Clinical Intake:  Pre-visit preparation completed: Yes  Pain : No/denies pain  Diabetes: No  Lab Results  Component Value Date   HGBA1C 6.2 (H) 04/02/2024   HGBA1C 5.8 (H) 07/13/2022     How often do you need  to have someone help you when you read instructions, pamphlets, or other written materials from your doctor or pharmacy?: 1 - Never  Interpreter Needed?: No  Information entered by :: Charmaine Bloodgood LPN   Activities of Daily Living     05/20/2024    9:11 AM  In your present state of health, do you have any difficulty performing the following activities:  Hearing? 0  Vision? 0  Difficulty concentrating or making decisions? 0  Walking or climbing stairs? 0  Dressing or bathing? 0  Doing errands, shopping? 0  Preparing Food and eating ? N  Using the Toilet? N  In the past six months, have you accidently leaked urine? N  Do you have problems with loss of bowel control? N  Managing your Medications? N  Managing your Finances? N  Housekeeping or managing your Housekeeping? N    Patient Care Team: Kayla Jeoffrey RAMAN, FNP as PCP - General (Family Medicine) Court Dorn PARAS, MD as PCP - Cardiology (Cardiology) Duke, Jon Garre, PA as Physician Assistant (Cardiology) Glendia Simmonds, OD as Referring Physician (Optometry) Mammography, [facility] (Diagnostic Radiology)  I have updated your Care Teams any recent Medical Services you may have received from other  providers in the past year.     Assessment:   This is a routine wellness examination for [place].  Hearing/Vision screen Hearing Screening - Comments:: Denies hearing difficulties   Vision Screening - Comments:: Wears rx glasses - up to date with routine eye exams with Dr. Simmonds Glendia     Goals Addressed             This Visit's Progress    Maintain health and independence   On track      Depression Screen     05/20/2024    9:52 AM 04/02/2024   10:33 AM  PHQ 2/9 Scores  PHQ - 2 Score 0 0  PHQ- 9 Score 2 2    Fall Risk     05/20/2024    9:54 AM 04/02/2024   10:33 AM  Fall Risk   Falls in the past year? 0 0  Number falls in past yr: 0 1  Injury with Fall? 0 0  Risk for fall due to : No Fall Risks No Fall Risks  Follow up Falls prevention discussed;Education provided;Falls evaluation completed Falls evaluation completed    MEDICARE RISK AT HOME:  Medicare Risk at Home Any stairs in or around the home?: Yes If so, are there any without handrails?: No Home free of loose throw rugs in walkways, pet beds, electrical cords, etc?: Yes Adequate lighting in your home to reduce risk of falls?: Yes Life alert?: No Use of a cane, walker or w/c?: No Grab bars in the bathroom?: Yes Shower chair or bench in shower?: No Elevated toilet seat or a handicapped toilet?: Yes  TIMED UP AND GO:  Was the test performed?  No  Cognitive Function: Declined/Normal: No cognitive concerns noted by patient or family. Patient alert, oriented, able to answer questions appropriately and recall recent events. No signs of memory loss or confusion.  Immunizations Immunization History  Administered Date(s) Administered   Influenza, High Dose Seasonal PF 06/10/2018   Influenza,inj,quad, With Preservative 09/09/2015   Pneumococcal Polysaccharide-23 06/10/2018   Td (Adult) 03/16/2005   Tdap 01/19/2015    Screening Tests Health Maintenance  Topic Date Due   DEXA SCAN  Never done   COVID-19  Vaccine (1 - 2024-25 season) Never done   INFLUENZA VACCINE  05/01/2024   Zoster Vaccines- Shingrix (1 of 2) 07/03/2024 (Originally 12/29/2002)   Pneumococcal Vaccine: 50+ Years (2 of 2 - PCV) 04/02/2025 (Originally 06/11/2019)   MAMMOGRAM  10/01/2024   DTaP/Tdap/Td (2 - Td or Tdap) 01/18/2025   Medicare Annual Wellness (AWV)  05/20/2025   Colonoscopy  03/14/2033   Hepatitis C Screening  Completed   HPV VACCINES  Aged Out   Meningococcal B Vaccine  Aged Out    Health Maintenance  Health Maintenance Due  Topic Date Due   DEXA SCAN  Never done   COVID-19 Vaccine (1 - 2024-25 season) Never done   INFLUENZA VACCINE  05/01/2024   Health Maintenance Items Addressed: Information provided on recommended vaccines   Additional Screening:  Vision Screening: Recommended annual ophthalmology exams for early detection of glaucoma and other disorders of the eye. Would you like a referral to an eye doctor? No    Dental Screening: Recommended annual dental exams for proper oral hygiene  Community Resource Referral / Chronic Care Management: CRR required this visit?  No   CCM required this visit?  No   Plan:    I have personally reviewed and noted the following in the patient's chart:   Medical and social history Use of alcohol, tobacco or illicit drugs  Current medications and supplements including opioid prescriptions. Patient is not currently taking opioid prescriptions. Functional ability and status Nutritional status Physical activity Advanced directives List of other physicians Hospitalizations, surgeries, and ER visits in previous 12 months Vitals Screenings to include cognitive, depression, and falls Referrals and appointments  In addition, I have reviewed and discussed with patient certain preventive protocols, quality metrics, and best practice recommendations. A written personalized care plan for preventive services as well as general preventive health recommendations  were provided to patient.   Lavelle Pfeiffer [place], [place]   1/79/7974   After Visit Summary: (MyChart) Due to this being a telephonic visit, the after visit summary with patients personalized plan was offered to patient via MyChart   Notes: Nothing significant to report at this time.

## 2024-05-21 ENCOUNTER — Ambulatory Visit (INDEPENDENT_AMBULATORY_CARE_PROVIDER_SITE_OTHER): Admitting: Family Medicine

## 2024-05-21 ENCOUNTER — Encounter: Payer: Self-pay | Admitting: Family Medicine

## 2024-05-21 VITALS — BP 120/78 | HR 71 | Temp 98.1°F | Ht 64.0 in | Wt 147.0 lb

## 2024-05-21 DIAGNOSIS — I2510 Atherosclerotic heart disease of native coronary artery without angina pectoris: Secondary | ICD-10-CM | POA: Diagnosis not present

## 2024-05-21 DIAGNOSIS — I5181 Takotsubo syndrome: Secondary | ICD-10-CM | POA: Diagnosis not present

## 2024-05-21 DIAGNOSIS — K219 Gastro-esophageal reflux disease without esophagitis: Secondary | ICD-10-CM | POA: Diagnosis not present

## 2024-05-21 DIAGNOSIS — E785 Hyperlipidemia, unspecified: Secondary | ICD-10-CM | POA: Diagnosis not present

## 2024-05-21 NOTE — Progress Notes (Signed)
 Subjective:  HPI: Allison Rangel is a 73 y.o. female presenting on 05/21/2024 for Medical Management of Chronic Issues (6 wk f/u/Concerns about taking prilosec long term )   HPI Patient is in today for chronic condition follow up. PMH includes MI, Takotsubo cardiomyopathy, nonobstructive CAD, HFimpEF, GERD. [name] had a recent hospitalization for NSTEMI from 6/22-6/24/25. Since our initial visit to establish care she has seen GI, see note below. Barium swallow showed a nonspecific dysmotility without clear findings of achalasia stricture or mass. They plan to proceed with EGD after ECHO if improvement in her LVEF. ECHO next month with Cardiology. Should be taking Losartan  and Metoprolol  for GDMT for Takotsubo cardiomyopathy however is not taking these. Would also like to stop taking Rosuvastatin . I advised against self-discontinuing and recommended she speak with cardiology.  Takotsubo cardiomyopathy: GDMT recommended losartan  and metoprolol , self discontinued due to low BP, ECHO next month, followed by cardiology  Nonobstructive CAD: followed by cardiology, recommended ASA  HLD goal LDL <70: at goal on rosuvastatin , advised against discontinuing  Prediabetes: diet and exercise controlled, A1c 6.2%  GERD: well controlled on Omeprazole 10mg  daily, would like to trial off  Denies chest pain, palpitations, recurrent headaches, vision changes, lightheadedness, dizziness, dyspnea on exertion, or swelling of extremities.    GI note 04/16/2024 for reference only: Unusual case, and while an upper endoscopy makes sense, it would be ideal to wait on that until improvement in LVEF if possible. That is, unless she has a severe stricture, which seems doubtful given the fact she is only had 2 episodes dysphagia and the most recent one occurred on a tiny piece of ice.  I agree with your assessment this sounds much more like esophageal spasm and mechanical cause of dysphagia.  That said, those will  be unpredictable.  Calcium  channel blockers such as diltiazem perhaps useful in that circumstance, though not likely the right class of medicine for her decreased LVEF (versus the beta-blocker and ARB she is on now).  I copied this to her cardiologist   Please arrange a barium esophagram  Cardiology note 04/08/2024 for reference only: Takotsubo cardiomyopathy - GDMT limited by BP - she is takin 25 mg losartan , self discontinued coreg , will start low dose toprol  for GDMT - Repeat BMP stable by PCP - Repeat echocardiogram in 2 months     Nonobstructive CAD - Risk factor management -Continue aspirin  monotherapy     Hyperlipidemia with LDL goal less than 70 - Continue 20 mg Crestor  and omega-3 fatty acids     Prediabetes - A1c 6.2% - encouraged activity     Echo in 2 months Follow up in 3 months.   Review of Systems  All other systems reviewed and are negative.   Relevant past medical history reviewed and updated as indicated.   Past Medical History:  Diagnosis Date   Allergy    GERD (gastroesophageal reflux disease)    Hyperlipidemia    Myocardial infarction [facility])      Past Surgical History:  Procedure Laterality Date   LEFT HEART CATH AND CORONARY ANGIOGRAPHY N/A 07/12/2022   Procedure: LEFT HEART CATH AND CORONARY ANGIOGRAPHY;  Surgeon: Dann Candyce RAMAN, MD;  Location: [facility] INVASIVE CV LAB;  Service: Cardiovascular;  Laterality: N/A;   LEFT HEART CATH AND CORONARY ANGIOGRAPHY N/A 03/23/2024   Procedure: LEFT HEART CATH AND CORONARY ANGIOGRAPHY;  Surgeon: Mady Bruckner, MD;  Location: [facility] INVASIVE CV LAB;  Service: Cardiovascular;  Laterality: N/A;   LUMBAR DISC SURGERY  7-8 years   WISDOM TOOTH EXTRACTION      Allergies and medications reviewed and updated.   Current Outpatient Medications:    aspirin  EC 81 MG tablet, Take 1 tablet (81 mg total) by mouth daily. Swallow whole., Disp: 90 tablet, Rfl: 3   Bromelains 100 MG CHEW, Chew 165 mg by mouth daily.,  Disp: , Rfl:    Latanoprost (XELPROS) 0.005 % EMUL, Place 1 drop into both eyes at bedtime., Disp: , Rfl:    Magnesium 200 MG TABS, Take 200 mg by mouth daily., Disp: , Rfl:    Multiple Vitamins-Calcium  (DAILY VITAMINS FOR WOMEN PO), Take 1 tablet by mouth daily., Disp: , Rfl:    nitroGLYCERIN  (NITROSTAT ) 0.4 MG SL tablet, Place 1 tablet (0.4 mg total) under the tongue every 5 (five) minutes x 3 doses as needed for chest pain., Disp: 25 tablet, Rfl: 3   Nutritional Supplements (OSTEO-MULTIVITAMINS/MINERALS PO), Take 1 tablet by mouth daily. Osteo-Matrix, Disp: , Rfl:    Omega-3 Fatty Acids (OMEGA 3 PO), Take 2 capsules by mouth daily at 12 noon., Disp: , Rfl:    omeprazole (PRILOSEC OTC) 20 MG tablet, Take 20 mg by mouth daily., Disp: , Rfl:    rosuvastatin  (CRESTOR ) 20 MG tablet, Take 1 tablet (20 mg total) by mouth daily., Disp: 90 tablet, Rfl: 3   Travoprost, BAK Free, (TRAVATAN) 0.004 % SOLN ophthalmic solution, Place 1 drop into both eyes at bedtime. (Patient not taking: Reported on 05/21/2024), Disp: , Rfl:   Allergies  Allergen Reactions   Bee Venom Hives and Itching   Sulfa Antibiotics Other (See Comments)     Not available   Sulfamethoxazole-Trimethoprim Other (See Comments)    Unknown reaction   Ivp Dye [Iodinated Contrast Media] Rash    Came home after getting it and was up all night. Hasn't had in 20 to 30 years.    Objective:   BP 120/78   Pulse 71   Temp 98.1 F (36.7 C)   Ht 5' 4 (1.626 m)   Wt 147 lb (66.7 kg)   SpO2 97%   BMI 25.23 kg/m      05/21/2024    8:51 AM 05/20/2024    9:06 AM 04/16/2024    2:52 PM  Vitals with BMI  Height 5' 4 5' 4 5' 4  Weight 147 lbs 146 lbs 146 lbs  BMI 25.22 25.05 25.05  Systolic 120 -- 120  Diastolic 78 -- 68  Pulse 71  69     Physical Exam Vitals and nursing note reviewed.  Constitutional:      Appearance: Normal appearance. She is normal weight.  HENT:     Head: Normocephalic and atraumatic.  Cardiovascular:      Rate and Rhythm: Normal rate and regular rhythm.     Pulses: Normal pulses.     Heart sounds: Normal heart sounds.  Pulmonary:     Effort: Pulmonary effort is normal.     Breath sounds: Normal breath sounds.  Skin:    General: Skin is warm and dry.  Neurological:     General: No focal deficit present.     Mental Status: She is alert and oriented to person, place, and time. Mental status is at baseline.  Psychiatric:        Mood and Affect: Mood normal.        Behavior: Behavior normal.        Thought Content: Thought content normal.        Judgment:  Judgment normal.     Assessment & Plan:  Takotsubo cardiomyopathy Assessment & Plan: Followed by Cardiology, has stopped losartan  and metoprolol  due to low BP, advised to notify cardiology and discussed risks. ECHO next month   Gastro-esophageal reflux disease without esophagitis Assessment & Plan: Followed by GI, pending EGD. Controlled with Omeprazole 10mg  daily. Will trial off. Elevated HOB if needed and avoid lying down 2-3 hours after eating, avoid coffee, alcohol, chocolate, fatty foods, citrus, carbonated beverages, spicy foods, late meals, and smoking. Return to office if symptoms return or worsen and seek medical care for difficulty swallowing, bleeding, anemia, weight loss, or recurrent vomiting.     Hyperlipidemia with target LDL less than 70 Assessment & Plan: Continue Crestor  20mg  daily. I recommend consuming a heart healthy diet such as Mediterranean diet or DASH diet with whole grains, fruits, vegetable, fish, lean meats, nuts, and olive oil. Limit sweets and processed foods. I also encourage moderate intensity exercise 150 minutes weekly. This is 3-5 times weekly for 30-50 minutes each session. Goal should be pace of 3 miles/hours, or walking 1.5 miles in 30 minutes. The ASCVD Risk score (Arnett DK, et al., 2019) failed to calculate for the following reasons:   Risk score cannot be calculated because patient has a  medical history suggesting prior/existing ASCVD    Coronary artery disease involving native heart without angina pectoris, unspecified vessel or lesion type Assessment & Plan: Risk factor management, BP, HLD, BG at goal, on bASA      Follow up plan: Return in about 6 months (around 11/21/2024) for chronic follow-up with labs 1 week prior.  Jeoffrey GORMAN Barrio, FNP

## 2024-05-21 NOTE — Assessment & Plan Note (Signed)
 Followed by GI, pending EGD. Controlled with Omeprazole 10mg  daily. Will trial off. Elevated HOB if needed and avoid lying down 2-3 hours after eating, avoid coffee, alcohol, chocolate, fatty foods, citrus, carbonated beverages, spicy foods, late meals, and smoking. Return to office if symptoms return or worsen and seek medical care for difficulty swallowing, bleeding, anemia, weight loss, or recurrent vomiting.

## 2024-05-21 NOTE — Assessment & Plan Note (Addendum)
 Risk factor management, BP, HLD, BG at goal, on bASA

## 2024-05-21 NOTE — Assessment & Plan Note (Signed)
 Followed by Cardiology, has stopped losartan  and metoprolol  due to low BP, advised to notify cardiology and discussed risks. ECHO next month

## 2024-05-21 NOTE — Assessment & Plan Note (Signed)
 Continue Crestor  20mg  daily. I recommend consuming a heart healthy diet such as Mediterranean diet or DASH diet with whole grains, fruits, vegetable, fish, lean meats, nuts, and olive oil. Limit sweets and processed foods. I also encourage moderate intensity exercise 150 minutes weekly. This is 3-5 times weekly for 30-50 minutes each session. Goal should be pace of 3 miles/hours, or walking 1.5 miles in 30 minutes. The ASCVD Risk score (Arnett DK, et al., 2019) failed to calculate for the following reasons:   Risk score cannot be calculated because patient has a medical history suggesting prior/existing ASCVD

## 2024-05-29 ENCOUNTER — Encounter: Payer: Self-pay | Admitting: Family Medicine

## 2024-06-03 ENCOUNTER — Ambulatory Visit (HOSPITAL_COMMUNITY)
Admission: RE | Admit: 2024-06-03 | Discharge: 2024-06-03 | Disposition: A | Discharge status: Home / Self Care | Source: Ambulatory Visit | Attending: Physician Assistant | Admitting: Physician Assistant

## 2024-06-03 ENCOUNTER — Ambulatory Visit: Payer: Self-pay | Admitting: Physician Assistant

## 2024-06-03 DIAGNOSIS — I5181 Takotsubo syndrome: Secondary | ICD-10-CM | POA: Insufficient documentation

## 2024-06-03 DIAGNOSIS — I428 Other cardiomyopathies: Secondary | ICD-10-CM | POA: Insufficient documentation

## 2024-06-03 LAB — ECHOCARDIOGRAM COMPLETE
Area-P 1/2: 3.31 cm2
Calc EF: 53.2 %
S' Lateral: 2.5 cm
Single Plane A2C EF: 51.8 %
Single Plane A4C EF: 55.9 %

## 2024-06-03 NOTE — Progress Notes (Signed)
*  PRELIMINARY RESULTS* Echocardiogram 2D Echocardiogram has been performed.  Allison Rangel 06/03/2024, 10:45 AM

## 2024-06-22 ENCOUNTER — Ambulatory Visit: Admitting: Cardiovascular Disease

## 2024-06-24 ENCOUNTER — Ambulatory Visit: Admitting: Physician Assistant

## 2024-06-29 ENCOUNTER — Telehealth: Payer: Self-pay | Admitting: Cardiovascular Disease

## 2024-06-29 ENCOUNTER — Ambulatory Visit: Attending: Cardiovascular Disease | Admitting: Cardiovascular Disease

## 2024-06-29 ENCOUNTER — Encounter: Payer: Self-pay | Admitting: Cardiovascular Disease

## 2024-06-29 VITALS — BP 140/84 | HR 79 | Ht 64.0 in | Wt 145.0 lb

## 2024-06-29 DIAGNOSIS — E785 Hyperlipidemia, unspecified: Secondary | ICD-10-CM | POA: Diagnosis not present

## 2024-06-29 DIAGNOSIS — I5181 Takotsubo syndrome: Secondary | ICD-10-CM | POA: Diagnosis not present

## 2024-06-29 DIAGNOSIS — I214 Non-ST elevation (NSTEMI) myocardial infarction: Secondary | ICD-10-CM

## 2024-06-29 NOTE — Telephone Encounter (Signed)
 From 06/29/24 Dr Court OV note:  History of hyperlipidemia on rosuvastatin  20 mg a day which he has since cut to 10 mg a day.  Her lipid profile performed 05/07/2024 revealed total cholesterol 177, LDL 69 and HDL of 94.  We will recheck a lipid liver profile in November.    Attempted to reach Meg with [employer].  Left voicemail of the above information and that medication dose will be determined after pt has had her lab in November.

## 2024-06-29 NOTE — Telephone Encounter (Signed)
 Pt c/o medication issue:  1. Name of Medication:   rosuvastatin  (CRESTOR ) 20 MG tablet   2. How are you currently taking this medication (dosage and times per day)?   Caller noted patient is only taking 10 mg daily  3. Are you having a reaction (difficulty breathing--STAT)?   4. What is your medication issue?   Caller (Meg) called to report patient is no longer taking Losartan  and is only taking half of her rosuvastatin  dosage and was checking if patient needed a new prescription for the lower dosage.

## 2024-06-29 NOTE — Progress Notes (Signed)
 06/29/2024 Allison Rangel   [birth year]  [id number]  Primary Physician Allison Jeoffrey RAMAN, FNP Primary Cardiologist: Allison Rangel Allison Rangel, [place]  HPI:  Allison Rangel is a 73 y.o. thin and fit appearing married Caucasian female, mother of 3 and grandmother of 5 grandchildren whose husband Allison Rangel is also a patient of mine.  She is a Pharmacologist.  This is her first posthospitalization visit with me after being admitted 03/20/2024 with chest pain and positive enzymes.  Risk factors include hyperlipidemia.  There is no family history of heart disease.  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  She currently denies chest pain or shortness of breath.  She is fairly active, walks her dog, does yoga and Pilates and swims without symptoms.   Current Meds  Medication Sig   aspirin  EC 81 MG tablet Take 1 tablet (81 mg total) by mouth daily. Swallow whole.   Magnesium 200 MG TABS Take 200 mg by mouth daily.   Multiple Vitamins-Calcium  (DAILY VITAMINS FOR WOMEN PO) Take 1 tablet by mouth daily.   nitroGLYCERIN  (NITROSTAT ) 0.4 MG SL tablet Place 1 tablet (0.4 mg total) under the tongue every 5 (five) minutes x 3 doses as needed for chest pain.   Nutritional Supplements (OSTEO-MULTIVITAMINS/MINERALS PO) Take 1 tablet by mouth daily. Osteo-Matrix   Omega-3 Fatty Acids (OMEGA 3 PO) Take 2 capsules by mouth daily at 12 noon.   omeprazole (PRILOSEC OTC) 20 MG tablet Take 20 mg by mouth daily.   rosuvastatin  (CRESTOR ) 20 MG tablet Take 1 tablet (20 mg total) by mouth daily.   Travoprost, BAK Free, (TRAVATAN) 0.004 % SOLN ophthalmic solution Place 1 drop into both eyes at bedtime.     Allergies  Allergen Reactions   Bee Venom Hives and Itching   Sulfa Antibiotics Other (See Comments)     Not available   Sulfamethoxazole-Trimethoprim Other (See Comments)    Unknown reaction   Ivp Dye [Iodinated Contrast Media] Rash    Came home  after getting it and was up all night. Hasn't had in 20 to 30 years.    Social History   Socioeconomic History   Marital status: Married    Spouse name: Not on file   Number of children: 3   Years of education: Not on file   Highest education level: Not on file  Occupational History   Occupation: retired  Tobacco Use   Smoking status: Never   Smokeless tobacco: Never  Vaping Use   Vaping status: Never Used  Substance and Sexual Activity   Alcohol use: Yes    Comment: rare   Drug use: Never   Sexual activity: Yes    Birth control/protection: Post-menopausal  Other Topics Concern   Not on file  Social History Narrative   Not on file   Social Drivers of Health   Financial Resource Strain: Low Risk  (05/19/2024)   Overall Financial Resource Strain (CARDIA)    Difficulty of Paying Living Expenses: Not hard at all  Food Insecurity: No Food Insecurity (05/19/2024)   Hunger Vital Sign    Worried About Running Out of Food in the Last Year: Never true    Ran Out of Food in the Last Year: Never true  Transportation Needs: No Transportation Needs (05/19/2024)   PRAPARE - Administrator, Civil Service (Medical): No    Lack of Transportation (Non-Medical): No  Physical Activity:  Sufficiently Active (05/19/2024)   Exercise Vital Sign    Days of Exercise per Week: 5 days    Minutes of Exercise per Session: 60 min  Stress: No Stress Concern Present (05/19/2024)   Allison Rangel of Occupational Health - Occupational Stress Questionnaire    Feeling of Stress: Only a little  Social Connections: Socially Integrated (05/19/2024)   Social Connection and Isolation Panel    Frequency of Communication with Friends and Family: More than three times a week    Frequency of Social Gatherings with Friends and Family: More than three times a week    Attends Religious Services: More than 4 times per year    Active Member of [employer] or Organizations: Yes    Attends Hospital doctor: More than 4 times per year    Marital Status: Married  Catering manager Violence: Not At Risk (05/20/2024)   Humiliation, Afraid, Rape, and Kick questionnaire    Fear of Current or Ex-Partner: No    Emotionally Abused: No    Physically Abused: No    Sexually Abused: No     Review of Systems: General: negative for chills, fever, night sweats or weight changes.  Cardiovascular: negative for chest pain, dyspnea on exertion, edema, orthopnea, palpitations, paroxysmal nocturnal dyspnea or shortness of breath Dermatological: negative for rash Respiratory: negative for cough or wheezing Urologic: negative for hematuria Abdominal: negative for nausea, vomiting, diarrhea, bright red blood per rectum, melena, or hematemesis Neurologic: negative for visual changes, syncope, or dizziness All other systems reviewed and are otherwise negative except as noted above.    Blood pressure (!) 140/84, pulse 79, height 5' 4 (1.626 m), weight 145 lb (65.8 kg), SpO2 96%.  General appearance: alert and no distress Neck: no adenopathy, no carotid bruit, no JVD, supple, symmetrical, trachea midline, and thyroid  not enlarged, symmetric, no tenderness/mass/nodules Lungs: clear to auscultation bilaterally Heart: regular rate and rhythm, S1, S2 normal, no murmur, click, rub or gallop Extremities: extremities normal, atraumatic, no cyanosis or edema Pulses: 2+ and symmetric Skin: Skin color, texture, turgor normal. No rashes or lesions Neurologic: Grossly normal  EKG EKG Interpretation Date/Time:  Monday June 29 2024 08:32:39 EDT Ventricular Rate:  74 PR Interval:  158 QRS Duration:  86 QT Interval:  370 QTC Calculation: 410 R Axis:   73  Text Interpretation: Normal sinus rhythm Nonspecific T wave abnormality When compared with ECG of 24-Mar-2024 07:46, Sinus rhythm has replaced Junctional rhythm Questionable change in QRS axis Criteria for Anterior infarct are no longer Present Criteria for  Inferior infarct are no longer Present T wave inversion no longer evident in Lateral leads QT has shortened Confirmed by Allison Rangel [id number]) on 06/29/2024 8:39:20 AM    ASSESSMENT AND PLAN:   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  Her EF at the time of her recent admission was 30 which normalized by 2D echo 06/03/2024.  She was discharged on GDMT but stopped her metoprolol  and losartan  2 months after discharge because of side effects.  She denies chest pain or shortness of breath.  Hyperlipidemia with target LDL less than 70 History of hyperlipidemia on rosuvastatin  20 mg a day which he has since cut to 10 mg a day.  Her lipid profile performed 05/07/2024 revealed total cholesterol 177, LDL 69 and HDL of 94.  We will recheck a lipid liver profile in November.  Allison DOROTHA Lesches Allison FACP,FACC,FAHA, [facility] 06/29/2024 8:39 AM

## 2024-06-29 NOTE — Patient Instructions (Signed)
 Medication Instructions:  Your physician recommends that you continue on your current medications as directed. Please refer to the Current Medication list given to you today.  *If you need a refill on your cardiac medications before your next appointment, please call your pharmacy*  Lab Work: Your physician recommends that you return for lab work in: November for FASTING lipid/liver panel  If you have labs (blood work) drawn today and your tests are completely normal, you will receive your results only by: [employer] (if you have MyChart) OR A paper copy in the mail If you have any lab test that is abnormal or we need to change your treatment, we will call you to review the results.   Follow-Up: At [facility], you and your health needs are our priority.  As part of our continuing mission to provide you with exceptional heart care, our providers are all part of one team.  This team includes your primary Cardiologist (physician) and Advanced Practice Providers or APPs (Physician Assistants and Nurse Practitioners) who all work together to provide you with the care you need, when you need it.  Your next appointment:   6 month(s)  Provider:   Callie Goodrich, PA-C, Rollo Louder, PA-C, or Damien Braver, NP        Then, Dorn Lesches, MD will plan to see you again in 12 month(s).    We recommend signing up for the patient portal called MyChart.  Sign up information is provided on this After Visit Summary.  MyChart is used to connect with patients for Virtual Visits (Telemedicine).  Patients are able to view lab/test results, encounter notes, upcoming appointments, etc.  Non-urgent messages can be sent to your provider as well.   To learn more about what you can do with MyChart, go to [email or web address].

## 2024-06-29 NOTE — Assessment & Plan Note (Signed)
 Ms. Feehan has had Takotsubo syndrome twice first 10/23 and more recently 6/25 with catheterizations done both times that showed clean coronary arteries.  Her EF at the time of her recent admission was 30 which normalized by 2D echo 06/03/2024.  She was discharged on GDMT but stopped her metoprolol  and losartan  2 months after discharge because of side effects.  She denies chest pain or shortness of breath.

## 2024-06-29 NOTE — Assessment & Plan Note (Signed)
 History of hyperlipidemia on rosuvastatin  20 mg a day which he has since cut to 10 mg a day.  Her lipid profile performed 05/07/2024 revealed total cholesterol 177, LDL 69 and HDL of 94.  We will recheck a lipid liver profile in November.

## 2024-07-07 NOTE — Progress Notes (Unsigned)
 Chief Complaint: Follow-up dysphagia  HPI:    Allison Rangel is a 73 year old Caucasian female, known to Dr. 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    01/24/15 esophagram showed a small nonreducible hiatal hernia with a distal esophageal ring however the 13 mm barium tablet passed through the ring without difficulty.  No evidence of reflux or esophagitis.     03/15/2023 colonoscopy for screening with internal hemorrhoids and otherwise normal.  Repeat recommended 10 years.    03/23/2024 echo done for chest pain with LVEF by estimation 30%, left ventricle had moderately decreased function and grade 1 diastolic dysfunction.    03/23/2024 left heart cath showed nonobstructive coronary artery disease.  Moderately to severe reduced LVEF and akinesis of the mid left ventricle consistent with Takotsubo variant (LVEF 30-35%).    03/20/2024 BMP with a sodium of 130.  This appears her normal.    04/02/2024 CMP normal.    04/16/2024 patient seen in clinic and described dysphagia episodes and getting choked, one-time on a tiny piece of ice.  At that visit discussed cardiac clearance.  Her procedure would need to be done at the hospital given decreased ejection fraction.  Continue on holistic remedies for reflux.  Discussed possible esophageal spasm and Altoids.  Dr. Legrand recommended a barium esophagram.  He also discussed with his cardiologist.    04/16/2024 cardiac clearance was given.    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  At that time Dr. Legrand discussed EGD was reasonable.  Wanted to wait until her repeat echo in September.  If EGD unrevealing then recommended manometry.    06/03/2024 echo with LVEF 55-60% and mild mitral valve regurg.    06/29/2024 office visit with cardiology.  At that time doing well.    Today, patient explains that she is doing very well.  She  has had no further symptoms using 10 mg of Omeprazole daily.  No further episodes of coughing or choking.  She does not think she needs an EGD at this point.  She follows with cardiology and her heart is back to normal per her.  She does asked some questions.    Denies fever, chills or weight loss.      Past Medical History:  Diagnosis Date   Allergy    GERD (gastroesophageal reflux disease)    Hyperlipidemia    Myocardial infarction [facility])     Past Surgical History:  Procedure Laterality Date   LEFT HEART CATH AND CORONARY ANGIOGRAPHY N/A 07/12/2022   Procedure: LEFT HEART CATH AND CORONARY ANGIOGRAPHY;  Surgeon: Dann Candyce RAMAN, MD;  Location: [facility] INVASIVE CV LAB;  Service: Cardiovascular;  Laterality: N/A;   LEFT HEART CATH AND CORONARY ANGIOGRAPHY N/A 03/23/2024   Procedure: LEFT HEART CATH AND CORONARY ANGIOGRAPHY;  Surgeon: Mady Bruckner, MD;  Location: [facility] INVASIVE CV LAB;  Service: Cardiovascular;  Laterality: N/A;   LUMBAR DISC SURGERY     7-8 years   WISDOM TOOTH EXTRACTION      Current Outpatient Medications  Medication Sig Dispense Refill   aspirin  EC 81 MG tablet Take 1 tablet (81 mg total) by mouth daily. Swallow whole. 90 tablet 3   Bromelains 100 MG CHEW Chew 165 mg by mouth daily.     Latanoprost (XELPROS) 0.005 % EMUL Place 1 drop into both eyes at bedtime.     Magnesium 200 MG TABS  Take 200 mg by mouth daily.     Multiple Vitamins-Calcium  (DAILY VITAMINS FOR WOMEN PO) Take 1 tablet by mouth daily.     nitroGLYCERIN  (NITROSTAT ) 0.4 MG SL tablet Place 1 tablet (0.4 mg total) under the tongue every 5 (five) minutes x 3 doses as needed for chest pain. 25 tablet 3   Nutritional Supplements (OSTEO-MULTIVITAMINS/MINERALS PO) Take 1 tablet by mouth daily. Osteo-Matrix     Omega-3 Fatty Acids (OMEGA 3 PO) Take 2 capsules by mouth daily at 12 noon.     omeprazole (PRILOSEC OTC) 20 MG tablet Take 20 mg by mouth daily.     rosuvastatin  (CRESTOR ) 20 MG tablet Take 1 tablet  (20 mg total) by mouth daily. 90 tablet 3   Travoprost, BAK Free, (TRAVATAN) 0.004 % SOLN ophthalmic solution Place 1 drop into both eyes at bedtime.     No current facility-administered medications for this visit.    Allergies as of 07/08/2024 - Review Complete 06/29/2024  Allergen Reaction Noted   Bee venom Hives and Itching 07/12/2022   Sulfa antibiotics Other (See Comments) 07/12/2022   Sulfamethoxazole-trimethoprim Other (See Comments) 05/13/2014   Ivp dye [iodinated contrast media] Rash 07/12/2022    Family History  Problem Relation Age of Onset   Stroke Father    Colon cancer Neg Hx    Liver disease Neg Hx    Esophageal cancer Neg Hx     Social History   Socioeconomic History   Marital status: Married    Spouse name: Not on file   Number of children: 3   Years of education: Not on file   Highest education level: Not on file  Occupational History   Occupation: retired  Tobacco Use   Smoking status: Never   Smokeless tobacco: Never  Vaping Use   Vaping status: Never Used  Substance and Sexual Activity   Alcohol use: Yes    Comment: rare   Drug use: Never   Sexual activity: Yes    Birth control/protection: Post-menopausal  Other Topics Concern   Not on file  Social History Narrative   Not on file   Social Drivers of Health   Financial Resource Strain: Low Risk  (05/19/2024)   Overall Financial Resource Strain (CARDIA)    Difficulty of Paying Living Expenses: Not hard at all  Food Insecurity: No Food Insecurity (05/19/2024)   Hunger Vital Sign    Worried About Running Out of Food in the Last Year: Never true    Ran Out of Food in the Last Year: Never true  Transportation Needs: No Transportation Needs (05/19/2024)   PRAPARE - Administrator, Civil Service (Medical): No    Lack of Transportation (Non-Medical): No  Physical Activity: Sufficiently Active (05/19/2024)   Exercise Vital Sign    Days of Exercise per Week: 5 days    Minutes of  Exercise per Session: 60 min  Stress: No Stress Concern Present (05/19/2024)   [name] of Occupational Health - Occupational Stress Questionnaire    Feeling of Stress: Only a little  Social Connections: Socially Integrated (05/19/2024)   Social Connection and Isolation Panel    Frequency of Communication with Friends and Family: More than three times a week    Frequency of Social Gatherings with Friends and Family: More than three times a week    Attends Religious Services: More than 4 times per year    Active Member of [employer] or Organizations: Yes    Attends Banker Meetings:  More than 4 times per year    Marital Status: Married  Catering manager Violence: Not At Risk (05/20/2024)   Humiliation, Afraid, Rape, and Kick questionnaire    Fear of Current or Ex-Partner: No    Emotionally Abused: No    Physically Abused: No    Sexually Abused: No    Review of Systems:    Constitutional: No weight loss, fever or chills Cardiovascular: No chest pain Respiratory: No SOB  Gastrointestinal: See HPI and otherwise negative   Physical Exam:  Vital signs: BP 112/70   Pulse 77   Ht 5' 4 (1.626 m)   Wt 146 lb (66.2 kg)   BMI 25.06 kg/m    Constitutional:   Pleasant elderly Caucasian female appears to be in NAD, Well developed, Well nourished, alert and cooperative Respiratory: Respirations even and unlabored. Lungs clear to auscultation bilaterally.   No wheezes, crackles, or rhonchi.  Cardiovascular: Normal S1, S2. No MRG. Regular rate and rhythm. No peripheral edema, cyanosis or pallor.  Gastrointestinal:  Soft, nondistended, nontender. No rebound or guarding. Normal bowel sounds. No appreciable masses or hepatomegaly. Rectal:  Not performed.  Psychiatric: Oriented to person, place and time. Demonstrates good judgement and reason without abnormal affect or behaviors.  MOST RECENT LABS AND IMAGING: CBC    Component Value Date/Time   WBC 3.5 (L) 03/23/2024 0010    RBC 4.62 03/23/2024 0010   HGB 14.3 03/23/2024 0010   HCT 41.9 03/23/2024 0010   PLT 178 03/23/2024 0010   MCV 90.7 03/23/2024 0010   MCH 31.0 03/23/2024 0010   MCHC 34.1 03/23/2024 0010   RDW 13.2 03/23/2024 0010    CMP     Component Value Date/Time   NA 138 05/07/2024 0948   K 4.2 05/07/2024 0948   CL 103 05/07/2024 0948   CO2 28 05/07/2024 0948   GLUCOSE 96 05/07/2024 0948   BUN 15 05/07/2024 0948   CREATININE 0.75 05/07/2024 0948   CALCIUM  9.5 05/07/2024 0948   PROT 6.6 05/07/2024 0948   ALBUMIN 4.7 07/12/2022 0004   AST 11 05/07/2024 0948   ALT 17 05/07/2024 0948   ALKPHOS 73 07/12/2022 0004   BILITOT 0.8 05/07/2024 0948   GFRNONAA >60 03/24/2024 0228   GFRAA >60 05/03/2011 0426    Assessment: 1.  GERD: Daily symptoms initially that would last for 5 minutes at a time, started to manage with peppermint oil, Swiss Chriss and apple cider vinegar and was doing fairly well, now with the addition of Omeprazole 10 mg daily has no further symptoms over the past 3 months, she would like to continue on this and does not feel that she needs further workup, recent barium esophagram with small hiatal hernia and mild presbyesophagus; likely GERD plus esophagitis and presbyesophagus contributing 2.  Dysphagia: A few distinct episodes; with above 3.  History of Takotsubo MI with initially decreased EF less than 35%, now back to normal: Apparently both of these episodes occurred after she choked on something 1 was 2 years ago on a piece of toast and just recently with a tiny piece of ice, followed with cardiology recently with echo showing normal EF now  Plan: 1.  Discussed results from barium esophagram with the patient and explained them to her.  She does not want any further workup at this point given that she has not had any symptoms over the past 3 months with the addition of Omeprazole 10 mg daily which she would like to continue. 2.  Refill  Omeprazole 10 mg daily #90 with 3  refills 3.  Patient will call if she has return of symptoms.  At that point recommend an EGD for further eval.  Otherwise she will follow in clinic as needed.  Delon Failing, PA-C [facility] 07/07/2024, 3:28 PM  Cc: Kayla Jeoffrey RAMAN, FNP

## 2024-07-08 ENCOUNTER — Encounter: Payer: Self-pay | Admitting: Physician Assistant

## 2024-07-08 ENCOUNTER — Ambulatory Visit: Admitting: Physician Assistant

## 2024-07-08 VITALS — BP 112/70 | HR 77 | Ht 64.0 in | Wt 146.0 lb

## 2024-07-08 DIAGNOSIS — R1310 Dysphagia, unspecified: Secondary | ICD-10-CM | POA: Diagnosis not present

## 2024-07-08 DIAGNOSIS — K219 Gastro-esophageal reflux disease without esophagitis: Secondary | ICD-10-CM | POA: Diagnosis not present

## 2024-07-08 DIAGNOSIS — Z8679 Personal history of other diseases of the circulatory system: Secondary | ICD-10-CM

## 2024-07-08 DIAGNOSIS — I5181 Takotsubo syndrome: Secondary | ICD-10-CM

## 2024-07-08 MED ORDER — OMEPRAZOLE 10 MG PO CPDR: 10.0000 mg | DELAYED_RELEASE_CAPSULE | Freq: Every day | ORAL | 5 refills | Status: AC

## 2024-07-08 NOTE — Progress Notes (Signed)
 ____________________________________________________________  Attending physician addendum:  Thank you for sending this case to me. I have reviewed the entire note and agree with the plan.   Victory Brand, MD  ____________________________________________________________

## 2024-07-09 ENCOUNTER — Telehealth: Payer: Self-pay | Admitting: Family Medicine

## 2024-07-09 NOTE — Telephone Encounter (Signed)
 Copied from CRM #[id number]. Topic: General - Other >> Jul 09, 2024 10:43 AM Mia F wrote: Reason for CRM: Pt says she missed a call from office. She says someone named Berwyn tried contacting her but no message was left. If pt is still needed please call her back

## 2024-07-13 ENCOUNTER — Ambulatory Visit: Admitting: Cardiovascular Disease

## 2024-07-30 ENCOUNTER — Ambulatory Visit: Payer: Self-pay | Admitting: Cardiovascular Disease

## 2024-07-30 LAB — LIPID PANEL
Chol/HDL Ratio: 1.8 ratio (ref 0.0–4.4)
Cholesterol, Total: 161 mg/dL (ref 100–199)
HDL: 91 mg/dL (ref >39)
LDL Chol Calc (NIH): 59 mg/dL (ref 0–99)
Triglycerides: 50 mg/dL (ref 0–149)
VLDL Cholesterol Cal: 11 mg/dL (ref 5–40)

## 2024-07-30 LAB — HEPATIC FUNCTION PANEL
ALT: 23 IU/L (ref 0–32)
AST: 18 IU/L (ref 0–40)
Albumin: 4.5 g/dL (ref 3.8–4.8)
Alkaline Phosphatase: 72 IU/L (ref 49–135)
Bilirubin Total: 0.7 mg/dL (ref 0.0–1.2)
Bilirubin, Direct: 0.23 mg/dL (ref 0.00–0.40)
Total Protein: 6.7 g/dL (ref 6.0–8.5)

## 2024-10-06 LAB — HM MAMMOGRAPHY

## 2024-10-07 ENCOUNTER — Encounter: Payer: Self-pay | Admitting: Family Medicine

## 2024-11-16 ENCOUNTER — Other Ambulatory Visit

## 2024-11-19 ENCOUNTER — Ambulatory Visit: Admitting: Family Medicine

## 2024-11-30 ENCOUNTER — Ambulatory Visit: Admitting: Family Medicine

## 2025-05-26 ENCOUNTER — Ambulatory Visit
